# Patient Record
Sex: Male | Born: 1970 | Race: Black or African American | Hispanic: No | Marital: Single | Smoking: Current some day smoker
Health system: Southern US, Community
[De-identification: ages and names within clinical notes are randomized; demographics above are authoritative.]

## PROBLEM LIST (undated history)

## (undated) DIAGNOSIS — I10 Essential (primary) hypertension: Secondary | ICD-10-CM

## (undated) DIAGNOSIS — I219 Acute myocardial infarction, unspecified: Secondary | ICD-10-CM

## (undated) HISTORY — PX: CORONARY ANGIOPLASTY WITH STENT PLACEMENT: SHX49

## (undated) HISTORY — DX: Morbid (severe) obesity due to excess calories: E66.01

---

## 1998-03-03 ENCOUNTER — Emergency Department (HOSPITAL_COMMUNITY): Admission: EM | Admit: 1998-03-03 | Discharge: 1998-03-03 | Payer: Self-pay | Admitting: Emergency Medicine

## 2005-07-02 ENCOUNTER — Emergency Department (HOSPITAL_COMMUNITY): Admission: EM | Admit: 2005-07-02 | Discharge: 2005-07-02 | Payer: Self-pay | Admitting: *Deleted

## 2005-12-21 ENCOUNTER — Emergency Department (HOSPITAL_COMMUNITY): Admission: EM | Admit: 2005-12-21 | Discharge: 2005-12-21 | Payer: Self-pay | Admitting: *Deleted

## 2006-05-14 ENCOUNTER — Emergency Department (HOSPITAL_COMMUNITY): Admission: EM | Admit: 2006-05-14 | Discharge: 2006-05-15 | Payer: Self-pay | Admitting: Emergency Medicine

## 2006-05-21 ENCOUNTER — Encounter: Admission: RE | Admit: 2006-05-21 | Discharge: 2006-06-24 | Payer: Self-pay | Admitting: Family Medicine

## 2006-07-15 ENCOUNTER — Emergency Department (HOSPITAL_COMMUNITY): Admission: EM | Admit: 2006-07-15 | Discharge: 2006-07-15 | Payer: Self-pay | Admitting: *Deleted

## 2008-03-05 ENCOUNTER — Emergency Department (HOSPITAL_COMMUNITY): Admission: EM | Admit: 2008-03-05 | Discharge: 2008-03-05 | Payer: Self-pay | Admitting: Emergency Medicine

## 2015-01-24 ENCOUNTER — Emergency Department (HOSPITAL_COMMUNITY): Payer: 59

## 2015-01-24 ENCOUNTER — Emergency Department (HOSPITAL_COMMUNITY)
Admission: EM | Admit: 2015-01-24 | Discharge: 2015-01-25 | Disposition: A | Discharge status: Home / Self Care | Payer: 59 | Attending: Emergency Medicine | Admitting: Emergency Medicine

## 2015-01-24 ENCOUNTER — Encounter (HOSPITAL_COMMUNITY): Payer: Self-pay | Admitting: *Deleted

## 2015-01-24 DIAGNOSIS — I10 Essential (primary) hypertension: Secondary | ICD-10-CM | POA: Insufficient documentation

## 2015-01-24 DIAGNOSIS — M545 Low back pain: Secondary | ICD-10-CM | POA: Diagnosis not present

## 2015-01-24 DIAGNOSIS — R1011 Right upper quadrant pain: Secondary | ICD-10-CM | POA: Diagnosis not present

## 2015-01-24 DIAGNOSIS — R109 Unspecified abdominal pain: Secondary | ICD-10-CM | POA: Diagnosis present

## 2015-01-24 DIAGNOSIS — Z79899 Other long term (current) drug therapy: Secondary | ICD-10-CM | POA: Diagnosis not present

## 2015-01-24 DIAGNOSIS — R112 Nausea with vomiting, unspecified: Secondary | ICD-10-CM | POA: Insufficient documentation

## 2015-01-24 HISTORY — DX: Essential (primary) hypertension: I10

## 2015-01-24 LAB — URINALYSIS, ROUTINE W REFLEX MICROSCOPIC
Bilirubin Urine: NEGATIVE
Glucose, UA: NEGATIVE mg/dL
Hgb urine dipstick: NEGATIVE
Ketones, ur: NEGATIVE mg/dL
Leukocytes, UA: NEGATIVE
Nitrite: NEGATIVE
Protein, ur: NEGATIVE mg/dL
Specific Gravity, Urine: 1.025 (ref 1.005–1.030)
Urobilinogen, UA: 0.2 mg/dL (ref 0.0–1.0)
pH: 6.5 (ref 5.0–8.0)

## 2015-01-24 LAB — CBC WITH DIFFERENTIAL/PLATELET
BASOS ABS: 0 10*3/uL (ref 0.0–0.1)
Basophils Relative: 0 %
Eosinophils Absolute: 0 10*3/uL (ref 0.0–0.7)
Eosinophils Relative: 0 %
HEMATOCRIT: 41.5 % (ref 39.0–52.0)
Hemoglobin: 14.8 g/dL (ref 13.0–17.0)
LYMPHS PCT: 16 %
Lymphs Abs: 1.6 10*3/uL (ref 0.7–4.0)
MCH: 29.4 pg (ref 26.0–34.0)
MCHC: 35.7 g/dL (ref 30.0–36.0)
MCV: 82.3 fL (ref 78.0–100.0)
Monocytes Absolute: 0.6 10*3/uL (ref 0.1–1.0)
Monocytes Relative: 6 %
NEUTROS ABS: 8.2 10*3/uL — AB (ref 1.7–7.7)
Neutrophils Relative %: 78 %
PLATELETS: 274 10*3/uL (ref 150–400)
RBC: 5.04 MIL/uL (ref 4.22–5.81)
RDW: 14.3 % (ref 11.5–15.5)
WBC: 10.4 10*3/uL (ref 4.0–10.5)

## 2015-01-24 LAB — COMPREHENSIVE METABOLIC PANEL WITH GFR
ALT: 53 U/L (ref 17–63)
AST: 34 U/L (ref 15–41)
Albumin: 4.6 g/dL (ref 3.5–5.0)
Alkaline Phosphatase: 96 U/L (ref 38–126)
Anion gap: 4 — ABNORMAL LOW (ref 5–15)
BUN: 12 mg/dL (ref 6–20)
CO2: 25 mmol/L (ref 22–32)
Calcium: 8.8 mg/dL — ABNORMAL LOW (ref 8.9–10.3)
Chloride: 104 mmol/L (ref 101–111)
Creatinine, Ser: 0.96 mg/dL (ref 0.61–1.24)
GFR calc Af Amer: >60 mL/min
GFR calc non Af Amer: >60 mL/min
Glucose, Bld: 166 mg/dL — ABNORMAL HIGH (ref 65–99)
Potassium: 3.2 mmol/L — ABNORMAL LOW (ref 3.5–5.1)
Sodium: 133 mmol/L — ABNORMAL LOW (ref 135–145)
Total Bilirubin: 0.5 mg/dL (ref 0.3–1.2)
Total Protein: 8 g/dL (ref 6.5–8.1)

## 2015-01-24 LAB — LIPASE, BLOOD: LIPASE: 20 U/L — AB (ref 22–51)

## 2015-01-24 MED ORDER — ONDANSETRON HCL 4 MG/2ML IJ SOLN
4.0000 mg | Freq: Once | INTRAMUSCULAR | Status: AC
  Administered 2015-01-25: 4 mg via INTRAVENOUS
  Filled 2015-01-24: qty 2

## 2015-01-24 MED ORDER — ONDANSETRON HCL 4 MG/2ML IJ SOLN
4.0000 mg | Freq: Once | INTRAMUSCULAR | Status: AC
  Administered 2015-01-24: 4 mg via INTRAVENOUS
  Filled 2015-01-24: qty 2

## 2015-01-24 MED ORDER — SODIUM CHLORIDE 0.9 % IV BOLUS (SEPSIS)
1000.0000 mL | Freq: Once | INTRAVENOUS | Status: AC
  Administered 2015-01-24: 1000 mL via INTRAVENOUS

## 2015-01-24 MED ORDER — NAPROXEN 500 MG PO TABS: 500.0000 mg | ORAL_TABLET | Freq: Two times a day (BID) | ORAL | Status: DC

## 2015-01-24 MED ORDER — FENTANYL CITRATE (PF) 100 MCG/2ML IJ SOLN
50.0000 ug | Freq: Once | INTRAMUSCULAR | Status: AC
  Administered 2015-01-24: 50 ug via INTRAVENOUS
  Filled 2015-01-24: qty 2

## 2015-01-24 MED ORDER — KETOROLAC TROMETHAMINE 30 MG/ML IJ SOLN
30.0000 mg | Freq: Once | INTRAMUSCULAR | Status: AC
  Administered 2015-01-25: 30 mg via INTRAVENOUS
  Filled 2015-01-24: qty 1

## 2015-01-24 MED ORDER — SODIUM CHLORIDE 0.9 % IV SOLN: INTRAVENOUS | Status: DC

## 2015-01-24 MED ORDER — HYDROCODONE-ACETAMINOPHEN 5-325 MG PO TABS: 1.0000 | ORAL_TABLET | Freq: Four times a day (QID) | ORAL | Status: DC | PRN

## 2015-01-24 MED ORDER — PROMETHAZINE HCL 25 MG PO TABS: 25.0000 mg | ORAL_TABLET | Freq: Four times a day (QID) | ORAL | Status: DC | PRN

## 2015-01-24 NOTE — ED Notes (Addendum)
Pt reporting right sided abdominal pain that radiates into back.  Reporting pain began a couple hours ago.  Reporting associated nausea. Reports pain began after eating.

## 2015-01-24 NOTE — ED Provider Notes (Addendum)
CSN: [id number]     Arrival date & time 01/24/15  2129 History   By signing my name below, I, Arlan Organ, attest that this documentation has been prepared under the direction and in the presence of Vanetta Mulders, MD. Electronically Signed: Arlan Organ, ED Scribe. 01/24/2015. 10:17 PM.   Chief Complaint  Patient presents with  . Abdominal Pain   The history is provided by the patient. No language interpreter was used.    HPI Comments: Benjamin Huang is a 44 y.o. male with a PMHx of HTN who presents to the Emergency Department complaining of constant, sudden, ongoing R sided abdominal pain that radiates into the back and groin x 3 hours after eating. Pain is currently rated 10/10. No aggravating or alleviating factors at this time. However, pt states "i cant get comfortable at all". Ongoing nausea and vomiting also reported. 2 episodes reported today. No OTC medications or home remedies attempted prior to arrival. No recent fever, chills, vomiting, diarrhea, dysuria, chest pain, or shortness of breath. Denies any personal history of kidney stones but admits to a family history. No known allergies to medications, but states he prefers not to take NSAIDS.  Past Medical History  Diagnosis Date  . HTN (hypertension)    History reviewed. No pertinent past surgical history. History reviewed. No pertinent family history. Social History  Substance Use Topics  . Smoking status: Never Smoker   . Smokeless tobacco: None  . Alcohol Use: No    Review of Systems  Constitutional: Negative for fever and chills.  HENT: Negative for rhinorrhea and sore throat.   Eyes: Negative for visual disturbance.  Respiratory: Negative for cough and shortness of breath.   Cardiovascular: Negative for chest pain and leg swelling.  Gastrointestinal: Positive for nausea, vomiting and abdominal pain. Negative for diarrhea.  Genitourinary: Positive for flank pain. Negative for dysuria.  Musculoskeletal: Positive  for back pain. Negative for joint swelling and neck pain.  Skin: Negative for rash.  Neurological: Negative for dizziness, light-headedness and headaches.  Hematological: Does not bruise/bleed easily.  Psychiatric/Behavioral: Negative for confusion.  All other systems reviewed and are negative.     Allergies  Review of patient's allergies indicates no known allergies.  Home Medications   Prior to Admission medications   Medication Sig Start Date End Date Taking? Authorizing Provider  amLODipine (NORVASC) 10 MG tablet Take 10 mg by mouth daily.   Yes Historical Provider, MD  hydrochlorothiazide (HYDRODIURIL) 25 MG tablet Take 25 mg by mouth daily.   Yes Historical Provider, MD  HYDROcodone-acetaminophen (NORCO/VICODIN) 5-325 MG tablet Take 1-2 tablets by mouth every 6 (six) hours as needed. 01/24/15   Vanetta Mulders, MD  naproxen (NAPROSYN) 500 MG tablet Take 1 tablet (500 mg total) by mouth 2 (two) times daily. 01/24/15   Vanetta Mulders, MD  promethazine (PHENERGAN) 25 MG tablet Take 1 tablet (25 mg total) by mouth every 6 (six) hours as needed. 01/24/15   Vanetta Mulders, MD   Triage Vitals: BP 150/90 mmHg  Pulse 80  Temp(Src) 97.9 F (36.6 C) (Oral)  Resp 14  Ht  (1.854 m)  Wt 290 lb (131.543 kg)  BMI 38.27 kg/m2  SpO2 100%   Physical Exam  Constitutional: He is oriented to person, place, and time. He appears well-developed and well-nourished.  HENT:  Head: Normocephalic and atraumatic.  Eyes: Conjunctivae and EOM are normal. Pupils are equal, round, and reactive to light.  Eyes track normal   Neck: Normal range of  motion.  Cardiovascular: Normal rate, regular rhythm, normal heart sounds and intact distal pulses.   Pulmonary/Chest: Effort normal and breath sounds normal. No respiratory distress.  Abdominal: Soft. He exhibits no distension. There is tenderness. There is no rebound and no guarding.  Mild tenderness to RUQ  Musculoskeletal: Normal range of motion.   Neurological: He is alert and oriented to person, place, and time. No cranial nerve deficit. He exhibits normal muscle tone. Coordination normal.  Skin: Skin is warm and dry.  Psychiatric: He has a normal mood and affect. Judgment normal.  Nursing note and vitals reviewed.   ED Course  Procedures (including critical care time)  DIAGNOSTIC STUDIES: Oxygen Saturation is 100% on RA, Normal by my interpretation.    COORDINATION OF CARE: 10:10 PM- Will give fluids, Zofran, and Sublimaze. Will order CT renal stone study, CBC, CMP, Lipase, and urinalysis. Discussed treatment plan with pt at bedside and pt agreed to plan.     Labs Review Labs Reviewed  CBC WITH DIFFERENTIAL/PLATELET - Abnormal; Notable for the following:    Neutro Abs 8.2 (*)    All other components within normal limits  COMPREHENSIVE METABOLIC PANEL - Abnormal; Notable for the following:    Sodium 133 (*)    Potassium 3.2 (*)    Glucose, Bld 166 (*)    Calcium 8.8 (*)    Anion gap 4 (*)    All other components within normal limits  LIPASE, BLOOD - Abnormal; Notable for the following:    Lipase 20 (*)    All other components within normal limits  URINALYSIS, ROUTINE W REFLEX MICROSCOPIC (NOT AT [facility])   Results for orders placed or performed during the hospital encounter of 01/24/15  CBC with Differential/Platelet  Result Value Ref Range   WBC 10.4 4.0 - 10.5 K/uL   RBC 5.04 4.22 - 5.81 MIL/uL   Hemoglobin 14.8 13.0 - 17.0 g/dL   HCT 82.9 56.2 - 13.0 %   MCV 82.3 78.0 - 100.0 fL   MCH 29.4 26.0 - 34.0 pg   MCHC 35.7 30.0 - 36.0 g/dL   RDW 86.5 78.4 - 69.6 %   Platelets 274 150 - 400 K/uL   Neutrophils Relative % 78 %   Neutro Abs 8.2 (H) 1.7 - 7.7 K/uL   Lymphocytes Relative 16 %   Lymphs Abs 1.6 0.7 - 4.0 K/uL   Monocytes Relative 6 %   Monocytes Absolute 0.6 0.1 - 1.0 K/uL   Eosinophils Relative 0 %   Eosinophils Absolute 0.0 0.0 - 0.7 K/uL   Basophils Relative 0 %   Basophils Absolute 0.0 0.0 - 0.1  K/uL  Comprehensive metabolic panel  Result Value Ref Range   Sodium 133 (L) 135 - 145 mmol/L   Potassium 3.2 (L) 3.5 - 5.1 mmol/L   Chloride 104 101 - 111 mmol/L   CO2 25 22 - 32 mmol/L   Glucose, Bld 166 (H) 65 - 99 mg/dL   BUN 12 6 - 20 mg/dL   Creatinine, Ser 2.95 0.61 - 1.24 mg/dL   Calcium 8.8 (L) 8.9 - 10.3 mg/dL   Total Protein 8.0 6.5 - 8.1 g/dL   Albumin 4.6 3.5 - 5.0 g/dL   AST 34 15 - 41 U/L   ALT 53 17 - 63 U/L   Alkaline Phosphatase 96 38 - 126 U/L   Total Bilirubin 0.5 0.3 - 1.2 mg/dL   GFR calc non Af Amer >60 >60 mL/min   GFR calc Af Amer >60 >  60 mL/min   Anion gap 4 (L) 5 - 15  Lipase, blood  Result Value Ref Range   Lipase 20 (L) 22 - 51 U/L  Urinalysis, Routine w reflex microscopic (not at [facility])  Result Value Ref Range   Color, Urine YELLOW YELLOW   APPearance CLEAR CLEAR   Specific Gravity, Urine 1.025 1.005 - 1.030   pH 6.5 5.0 - 8.0   Glucose, UA NEGATIVE NEGATIVE mg/dL   Hgb urine dipstick NEGATIVE NEGATIVE   Bilirubin Urine NEGATIVE NEGATIVE   Ketones, ur NEGATIVE NEGATIVE mg/dL   Protein, ur NEGATIVE NEGATIVE mg/dL   Urobilinogen, UA 0.2 0.0 - 1.0 mg/dL   Nitrite NEGATIVE NEGATIVE   Leukocytes, UA NEGATIVE NEGATIVE     Imaging Review Ct Renal Stone Study  01/24/2015   CLINICAL DATA:  Right flank pain for 3 hr  EXAM: CT ABDOMEN AND PELVIS WITHOUT CONTRAST  TECHNIQUE: Multidetector CT imaging of the abdomen and pelvis was performed following the standard protocol without IV contrast.  COMPARISON:  None.  FINDINGS: No hydronephrosis.  No urinary calculus.  Diffuse hepatic steatosis. Relative sparing adjacent to the gallbladder  Gallbladder, spleen, pancreas, adrenal glands are within normal limits.  Normal appendix.  Bladder and prostate are within normal limits.  Degenerative disc disease at L5-S1 with vacuum disc and disc space narrowing.  IMPRESSION: No evidence of urinary calculus or urinary obstruction. Normal appendix.   Electronically Signed    By: Jolaine Click M.D.   On: 01/24/2015 23:42   I have personally reviewed and evaluated these images and lab results as part of my medical decision-making.   EKG Interpretation None      MDM   Final diagnoses:  Flank pain    Patient with acute onset of right-sided right flank right back pain started a couple hours prior to arrival. No history of kidney stones however there is a family history of kidney stones. Patient felt fine earlier the day then suddenly had very severe pain 10 out of 10. Clinically suggestive of renal colic. Patient was unable to sit still. CT scan ordered renal stone study to confirm. Urinalysis without any hematuria. No significant leukocytosis. Mild hypokalemia and hyponatremia but no other significant electrolyte abnormalities. No liver function test abnormalities. Lipase not consistent with pancreatitis. The possibility of such severe pain could've possibly been gallbladder but this pain did radiate into the right groin clinically most likely renal colic.  I, Jamiere Gulas, personally performed the services described in this documentation. All medical record entries made by the scribe were at my direction and in my presence.  I have reviewed the chart and discharge instructions and agree that the record reflects my personal performance and is accurate and complete. Teresha Hanks.  01/24/2015. 11:55 PM.     Vanetta Mulders, MD 01/24/15 2337   CT scan now without any evidence of renal stone in addition gallbladder is normal appendix is normal. Do not have an explanation for the severe right flank right sided abdominal pain that radiated into the groin. Patient improved significantly with medication here. Patient stable for discharge home.  Vanetta Mulders, MD 01/24/15 2356

## 2015-01-24 NOTE — Discharge Instructions (Signed)
Workup for the pain without any significant abnormalities. CT scan of the abdomen showed no evidence of any kidney stone appendix was normal gallbladder was normal. Take of the Naprosyn as needed for pain. Can supplement with hydrocodone as needed for additional pain. Take Phenergan for nausea and vomiting. Return for any new or worse symptoms. Work note provided.

## 2015-02-18 ENCOUNTER — Other Ambulatory Visit (HOSPITAL_COMMUNITY): Payer: Self-pay | Admitting: Nurse Practitioner

## 2015-02-18 DIAGNOSIS — R1011 Right upper quadrant pain: Secondary | ICD-10-CM

## 2015-02-22 ENCOUNTER — Encounter (HOSPITAL_COMMUNITY)
Admission: RE | Admit: 2015-02-22 | Discharge: 2015-02-22 | Disposition: A | Discharge status: Home / Self Care | Payer: 59 | Source: Ambulatory Visit | Attending: Nurse Practitioner | Admitting: Nurse Practitioner

## 2015-02-22 ENCOUNTER — Encounter (HOSPITAL_COMMUNITY): Payer: Self-pay

## 2015-02-22 DIAGNOSIS — R1011 Right upper quadrant pain: Secondary | ICD-10-CM | POA: Diagnosis present

## 2015-02-22 MED ORDER — TECHNETIUM TC 99M MEBROFENIN IV KIT
5.0000 | PACK | Freq: Once | INTRAVENOUS | Status: DC | PRN
  Administered 2015-02-22: 5 via INTRAVENOUS
  Filled 2015-02-22: qty 6

## 2015-02-22 MED ORDER — STERILE WATER FOR INJECTION IJ SOLN
INTRAMUSCULAR | Status: AC
  Administered 2015-02-22: 2.57 mL via INTRAVENOUS
  Filled 2015-02-22: qty 10

## 2015-02-22 MED ORDER — SINCALIDE 5 MCG IJ SOLR
INTRAMUSCULAR | Status: AC
Start: 2015-02-22 — End: 2015-02-22
  Administered 2015-02-22: 2.57 ug via INTRAVENOUS
  Filled 2015-02-22: qty 5

## 2015-02-22 MED ORDER — SODIUM CHLORIDE 0.9 % IJ SOLN
INTRAMUSCULAR | Status: AC
  Filled 2015-02-22: qty 12

## 2018-12-02 ENCOUNTER — Other Ambulatory Visit (HOSPITAL_COMMUNITY): Payer: Self-pay | Admitting: Family

## 2018-12-02 DIAGNOSIS — R7401 Elevation of levels of liver transaminase levels: Secondary | ICD-10-CM

## 2018-12-10 ENCOUNTER — Ambulatory Visit (HOSPITAL_COMMUNITY)
Admission: RE | Admit: 2018-12-10 | Discharge: 2018-12-10 | Disposition: A | Discharge status: Home / Self Care | Payer: BLUE CROSS/BLUE SHIELD | Source: Ambulatory Visit | Attending: Family | Admitting: Family

## 2018-12-10 ENCOUNTER — Other Ambulatory Visit: Payer: Self-pay

## 2018-12-10 DIAGNOSIS — R740 Nonspecific elevation of levels of transaminase and lactic acid dehydrogenase [LDH]: Secondary | ICD-10-CM | POA: Diagnosis present

## 2018-12-10 DIAGNOSIS — R7401 Elevation of levels of liver transaminase levels: Secondary | ICD-10-CM

## 2019-01-13 ENCOUNTER — Encounter: Payer: Self-pay | Admitting: Internal Medicine

## 2019-02-03 ENCOUNTER — Other Ambulatory Visit: Payer: Self-pay

## 2019-02-03 ENCOUNTER — Ambulatory Visit (INDEPENDENT_AMBULATORY_CARE_PROVIDER_SITE_OTHER): Payer: BLUE CROSS/BLUE SHIELD | Admitting: Nurse Practitioner

## 2019-02-03 ENCOUNTER — Telehealth: Payer: Self-pay

## 2019-02-03 ENCOUNTER — Encounter: Payer: Self-pay | Admitting: Nurse Practitioner

## 2019-02-03 DIAGNOSIS — R7989 Other specified abnormal findings of blood chemistry: Secondary | ICD-10-CM | POA: Diagnosis not present

## 2019-02-03 DIAGNOSIS — K760 Fatty (change of) liver, not elsewhere classified: Secondary | ICD-10-CM | POA: Diagnosis not present

## 2019-02-03 DIAGNOSIS — R935 Abnormal findings on diagnostic imaging of other abdominal regions, including retroperitoneum: Secondary | ICD-10-CM | POA: Diagnosis not present

## 2019-02-03 NOTE — Patient Instructions (Addendum)
Your health issues we discussed today were:   Elevated liver enzymes and mildly abnormal ultrasound: 1. Because of an area of likely "fatty sparing" we will check an MRI to make sure nothing more concerning is present 2. Otherwise, your ultrasound looks like you have fatty liver disease which we discussed 3. I recommend working on diet and exercise to obtain good blood pressure control, cholesterol control, blood sugar control.  This will help with fatty liver 4. When we receive your labs from your primary care provider we will decide any further labs that need to be checked at this time 5. As I discussed, we could refer you to a dietitian at any point you feel this would be helpful 6. I am printing information below related to fatty liver disease 7. Call us if you have any worsening or severe symptoms  Overall I recommend:  1. Return for follow-up in 3 months 2. Call us if you have any questions or concerns 3. Continue your other current medications   Because of recent events of COVID-19 ("Coronavirus"), follow CDC recommendations:  1. Wash your hand frequently 2. Avoid touching your face 3. Stay away from people who are sick 4. If you have symptoms such as fever, cough, shortness of breath then call your healthcare provider for further guidance 5. If you are sick, STAY AT HOME unless otherwise directed by your healthcare provider. 6. Follow directions from state and national officials regarding staying safe   [facility] we value your feedback. You may receive a survey about your visit today. Please share your experience as we strive to create trusting relationships with our patients to provide genuine, compassionate, quality care.  We appreciate your understanding and patience as we review any laboratory studies, imaging, and other diagnostic tests that are ordered as we care for you. Our office policy is 5 business days for review of these results, and any emergent  or urgent results are addressed in a timely manner for your best interest. If you do not hear from our office in 1 week, please contact us.   We also encourage the use of MyChart, which contains your medical information for your review as well. If you are not enrolled in this feature, an access code is on this after visit summary for your convenience. Thank you for allowing us to be involved in your care.  It was great to see you today!  I hope you have a great Fall!!      Fatty Liver Disease  Fatty liver disease occurs when too much fat has built up in your liver cells. Fatty liver disease is also called hepatic steatosis or steatohepatitis. The liver removes harmful substances from your bloodstream and produces fluids that your body needs. It also helps your body use and store energy from the food you eat. In many cases, fatty liver disease does not cause symptoms or problems. It is often diagnosed when tests are being done for other reasons. However, over time, fatty liver can cause inflammation that may lead to more serious liver problems, such as scarring of the liver (cirrhosis) and liver failure. Fatty liver is associated with insulin resistance, increased body fat, high blood pressure (hypertension), and high cholesterol. These are features of metabolic syndrome and increase your risk for stroke, diabetes, and heart disease. What are the causes? This condition may be caused by:  Drinking too much alcohol.  Poor nutrition.  Obesity.  Cushing's syndrome.  Diabetes.  High cholesterol.  Certain drugs.  Poisons.  Some viral infections.  Pregnancy. What increases the risk? You are more likely to develop this condition if you:  Abuse alcohol.  Are overweight.  Have diabetes.  Have hepatitis.  Have a high triglyceride level.  Are pregnant. What are the signs or symptoms? Fatty liver disease often does not cause symptoms. If symptoms do develop, they can include:   Fatigue.  Weakness.  Weight loss.  Confusion.  Abdominal pain.  Nausea and vomiting.  Yellowing of your skin and the white parts of your eyes (jaundice).  Itchy skin. How is this diagnosed? This condition may be diagnosed by:  A physical exam and medical history.  Blood tests.  Imaging tests, such as an ultrasound, CT scan, or MRI.  A liver biopsy. A small sample of liver tissue is removed using a needle. The sample is then looked at under a microscope. How is this treated? Fatty liver disease is often caused by other health conditions. Treatment for fatty liver may involve medicines and lifestyle changes to manage conditions such as:  Alcoholism.  High cholesterol.  Diabetes.  Being overweight or obese. Follow these instructions at home:   Do not drink alcohol. If you have trouble quitting, ask your health care provider how to safely quit with the help of medicine or a supervised program. This is important to keep your condition from getting worse.  Eat a healthy diet as told by your health care provider. Ask your health care provider about working with a diet and nutrition specialist (dietitian) to develop an eating plan.  Exercise regularly. This can help you lose weight and control your cholesterol and diabetes. Talk to your health care provider about an exercise plan and which activities are best for you.  Take over-the-counter and prescription medicines only as told by your health care provider.  Keep all follow-up visits as told by your health care provider. This is important. Contact a health care provider if: You have trouble controlling your:  Blood sugar. This is especially important if you have diabetes.  Cholesterol.  Drinking of alcohol. Get help right away if:  You have abdominal pain.  You have jaundice.  You have nausea and vomiting.  You vomit blood or material that looks like coffee grounds.  You have stools that are black, tar-like,  or bloody. Summary  Fatty liver disease develops when too much fat builds up in the cells of your liver.  Fatty liver disease often causes no symptoms or problems. However, over time, fatty liver can cause inflammation that may lead to more serious liver problems, such as scarring of the liver (cirrhosis).  You are more likely to develop this condition if you abuse alcohol, are pregnant, are overweight, have diabetes, have hepatitis, or have high triglyceride levels.  Contact your health care provider if you have trouble controlling your weight, blood sugar, cholesterol, or drinking of alcohol. This information is not intended to replace advice given to you by your health care provider. Make sure you discuss any questions you have with your health care provider. Document Released: 05/25/2005 Document Revised: 03/22/2017 Document Reviewed: 01/16/2017 Elsevier Patient Education  2020 Elsevier Inc.     Nonalcoholic Fatty Liver Disease Diet, Adult Nonalcoholic fatty liver disease is a condition that causes fat to build up in and around the liver. The disease makes it harder for the liver to work the way that it should. Following a healthy diet can help to keep nonalcoholic fatty liver disease under control. It can also  help to prevent or improve conditions that are associated with the disease, such as heart disease, diabetes, high blood pressure, and abnormal cholesterol levels. Along with regular exercise, this diet:  Promotes weight loss.  Helps to control blood sugar levels.  Helps to improve the way that the body uses insulin. What are tips for following this plan? Reading food labels Always check food labels for:  The amount of saturated fat in a food. You should limit your intake of saturated fat. Saturated fat is found in foods that come from animals, including meat and dairy products such as butter, cheese, and whole milk.  The amount of fiber in a food. You should choose  high-fiber foods such as fruits, vegetables, and whole grains. Try to get 25-30 grams (g) of fiber a day.  Cooking  When cooking, use heart-healthy oils that are high in monounsaturated fats. These include olive oil, canola oil, and avocado oil.  Limit frying or deep-frying foods. Cook foods using healthy methods such as baking, boiling, steaming, and grilling instead. Meal planning  You may want to keep track of how many calories you take in. Eating the right amount of calories will help you achieve a healthy weight. Meeting with a registered dietitian can help you get started.  Limit how often you eat takeout and fast food. These foods are usually very high in fat, salt, and sugar.  Use the glycemic index (GI) to plan your meals. The index tells you how quickly a food will raise your blood sugar. Choose low-GI foods (GI less than 55). These foods take a longer time to raise blood sugar. A registered dietitian can help you identify foods lower on the GI scale. Lifestyle  You may want to follow a Mediterranean diet. This diet includes a lot of vegetables, lean meats or fish, whole grains, fruits, and healthy oils and fats. What foods can I eat?  Fruits Bananas. Apples. Oranges. Grapes. Papaya. Mango. Pomegranate. Kiwi. Grapefruit. Cherries. Vegetables Lettuce. Spinach. Peas. Beets. Cauliflower. Cabbage. Broccoli. Carrots. Tomatoes. Squash. Eggplant. Herbs. Peppers. Onions. Cucumbers. Brussels sprouts. Yams and sweet potatoes. Beans. Lentils. Grains Whole wheat or whole-grain foods, including breads, crackers, cereals, and pasta. Stone-ground whole wheat. Unsweetened oatmeal. Bulgur. Barley. Quinoa. Minn[place] or wild rice. Corn or whole wheat flour tortillas. Meats and other proteins Lean meats. Poultry. Tofu. Seafood and shellfish. Dairy Low-fat or fat-free dairy products, such as yogurt, cottage cheese, or cheese. Beverages Water. Sugar-free drinks. Tea. Coffee. Low-fat or skim milk. Milk  alternatives, such as soy or almond milk. Real fruit juice. Fats and oils Avocado. Canola or olive oil. Nuts and nut butters. Seeds. Seasonings and condiments Mustard. Relish. Low-fat, low-sugar ketchup and barbecue sauce. Low-fat or fat-free mayonnaise. Sweets and desserts Sugar-free sweets. The items listed above may not be a complete list of foods and beverages you can eat. Contact a dietitian for more information. What foods should I limit or avoid? Meats and other proteins Limit red meat to 1-2 times a week. Dairy [employer]. Fats and oils Palm oil and coconut oil. Fried foods. Other foods Processed foods. Foods that contain a lot of salt or sodium. Sweets and desserts Sweets that contain sugar. Beverages Sweetened drinks, such as sweet tea, milkshakes, iced sweet drinks, and sodas. Alcohol. The items listed above may not be a complete list of foods and beverages you should avoid. Contact a dietitian for more information. Where to find more information The [employer] of Diabetes and Digestive and Kidney Diseases: [email or web address] Summary  Nonalcoholic fatty liver disease is a condition that causes fat to build up in and around the liver.  Following a healthy diet can help to keep nonalcoholic fatty liver disease under control. Your diet should be rich in fruits, vegetables, whole grains, and lean proteins.  Limit your intake of saturated fat. Saturated fat is found in foods that come from animals, including meat and dairy products such as butter, cheese, and whole milk.  This diet promotes weight loss, helps to control blood sugar levels, and helps to improve the way that the body uses insulin. This information is not intended to replace advice given to you by your health care provider. Make sure you discuss any questions you have with your health care provider. Document Released: 08/24/2014 Document Revised: 08/01/2018 Document Reviewed: 05/01/2018 Elsevier Patient  Education  2020 [employer].

## 2019-02-03 NOTE — Assessment & Plan Note (Signed)
Ultrasound demonstrated hepatic steatosis with a few nonspecific hypoechoic areas adjacent to the gallbladder fossa, one of which appears masslike measures 1.8 cm in diameter that most likely represents focal fatty sparing as this was present on prior CT in October 2016.  However, unable to definitively say on ultrasound and recommended liver MRI.  At this point we will proceed with MRI of the liver.  Follow-up in 3 months.

## 2019-02-03 NOTE — Addendum Note (Signed)
Addended by: Gordy Levan, Averie Meiner A on: 02/03/2019 10:14 AM   Modules accepted: Orders

## 2019-02-03 NOTE — Assessment & Plan Note (Signed)
The patient had reported abnormal LFTs.  However, labs from included with his office visit.  LFTs in 2016 were normal.  We have requested his updated LFTs from his primary care.  Given his ultrasound findings, body habitus, past medical history of hypertension he likely has fatty liver disease.  At this point will hold off on further serologic evaluation until we receive his previously drawn LFTs.  We may simply just need to repeat his labs for monitoring.  I discussed with him fatty liver disease and the treatment cornerstones including diet and exercise.  Discussed the need for blood pressure, cholesterol, blood sugar control.  I offered that we could refer to a dietitian at any point that he needs or would like this.  He has previously lost weight and will start working on this again.  Follow-up in 3 months.  I will provide printed information on fatty liver disease.

## 2019-02-03 NOTE — Progress Notes (Signed)
Primary Care Physician:  Erasmo Downer, NP Primary Gastroenterologist:  Dr. Jena Gauss  Chief Complaint  Patient presents with  . Elevated Hepatic Enzymes    HPI:   Benjamin Huang is a 48 y.o. male who presents on referral from primary care for elevated liver enzymes.  Reviewed information provided with referral including his visit dated 11/17/2018 which is a physical exam.  Noted BMI elevated between 40-45.  As part of his physical a CMP was checked which apparently had elevated liver enzymes.  Labs were not included with this referral.  Ultrasound report (as reviewed below) was included.  Only CMP on file with our system dated 01/24/2015 which found completely normal LFTs.  Ultrasound of the abdomen dated 12/10/2018 which found findings consistent with hepatic steatosis as well as a few nonspecific hypoechoic areas adjacent to the gallbladder fossa, one of which appears masslike measures 1.8 cm in diameter that most likely represents focal fatty sparing as this was present on prior CT in October 2016.  Liver MRI may be considered for further evaluation to exclude a pathologic lesion.  No history of colonoscopy or endoscopy in our system.  Today he states he's doing well overall. Has been told he needs to lose weight for a while. He admits some intermittent mild discomfort which is pretty rare, typically improves when he loses weight and eats better; when he slacks and puts on more weight the discomfort becomes more common. Denies N/V, hematochezia, melena, fever, chills, unintentional weight loss. Denies URI or flu-like symptoms. Denies loss of sense of taste or smell. Denies chest pain, dyspnea, dizziness, lightheadedness, syncope, near syncope. Denies any other upper or lower GI symptoms.  He states his BP is often elevated in the morning, just took BP meds 10 mins prior to his OV. Denies headache, dizziness, chest pain.  Past Medical History:  Diagnosis Date  . HTN (hypertension)   .  Obesity, morbid, BMI 40.0-49.9 (HCC)     History reviewed. No pertinent surgical history.  Current Outpatient Medications  Medication Sig Dispense Refill  . amLODipine (NORVASC) 10 MG tablet Take 10 mg by mouth daily.    . hydrochlorothiazide (HYDRODIURIL) 25 MG tablet Take 25 mg by mouth daily.    Marland Kitchen losartan (COZAAR) 50 MG tablet Take 1 tablet by mouth daily.    . Omega-3 Fatty Acids (FISH OIL PO) Take by mouth as needed.     No current facility-administered medications for this visit.     Allergies as of 02/03/2019  . (No Known Allergies)    Family History  Problem Relation Age of Onset  . Colon cancer Neg Hx   . Liver disease Neg Hx     Social History   Socioeconomic History  . Marital status: Single    Spouse name: Not on file  . Number of children: Not on file  . Years of education: Not on file  . Highest education level: Not on file  Occupational History  . Not on file  Social Needs  . Financial resource strain: Not on file  . Food insecurity    Worry: Not on file    Inability: Not on file  . Transportation needs    Medical: Not on file    Non-medical: Not on file  Tobacco Use  . Smoking status: Current Some Day Smoker    Types: Cigars  . Smokeless tobacco: Never Used  . Tobacco comment: cigar every now and then  Substance and Sexual Activity  .  Alcohol use: Yes    Comment: occas  . Drug use: No  . Sexual activity: Not on file  Lifestyle  . Physical activity    Days per week: Not on file    Minutes per session: Not on file  . Stress: Not on file  Relationships  . Social Herbalist on phone: Not on file    Gets together: Not on file    Attends religious service: Not on file    Active member of club or organization: Not on file    Attends meetings of clubs or organizations: Not on file    Relationship status: Not on file  . Intimate partner violence    Fear of current or ex partner: Not on file    Emotionally abused: Not on file     Physically abused: Not on file    Forced sexual activity: Not on file  Other Topics Concern  . Not on file  Social History Narrative  . Not on file    Review of Systems: General: Negative for anorexia, weight loss, fever, chills, fatigue, weakness. ENT: Negative for hoarseness, difficulty swallowing. CV: Negative for chest pain, angina, palpitations, peripheral edema.  Respiratory: Negative for dyspnea at rest, cough, sputum, wheezing.  GI: See history of present illness. MS: Negative for joint pain, low back pain.  Derm: Negative for rash or itching.  Endo: Negative for unusual weight change.  Heme: Negative for bruising or bleeding. Allergy: Negative for rash or hives.    Physical Exam: BP (!) 157/106   Pulse 83   Temp (!) 97 F (36.1 C)   Ht 6' (1.829 m)   Wt (!) 310 lb (140.6 kg)   BMI 42.04 kg/m  General:   Obese male. Alert and oriented. Pleasant and cooperative. Well-nourished and well-developed.  Head:  Normocephalic and atraumatic. Eyes:  Without icterus, sclera clear and conjunctiva pink.  Ears:  Normal auditory acuity. Cardiovascular:  S1, S2 present without murmurs appreciated. Extremities without clubbing or edema. Respiratory:  Clear to auscultation bilaterally. No wheezes, rales, or rhonchi. No distress.  Gastrointestinal:  +BS, soft, non-tender and non-distended. No HSM noted. No guarding or rebound. No masses appreciated.  Rectal:  Deferred  Musculoskalatal:  Symmetrical without gross deformities. Neurologic:  Alert and oriented x4;  grossly normal neurologically. Psych:  Alert and cooperative. Normal mood and affect. Heme/Lymph/Immune: No excessive bruising noted.    02/03/2019 9:53 AM   Disclaimer: This note was dictated with voice recognition software. Similar sounding words can inadvertently be transcribed and may not be corrected upon review.

## 2019-02-03 NOTE — Telephone Encounter (Signed)
PA for MRI Liver w/wo contrast submitted via [employer]. Case approved. Order ID: [id number], valid 02/03/19-08/01/19. [facility] as [facility] [address] [place], [place]. However pt will be having MRI at [facility] in [place]. Tried to change facility but case under review and unable to change facility at this time.

## 2019-02-12 ENCOUNTER — Ambulatory Visit (HOSPITAL_COMMUNITY): Payer: BLUE CROSS/BLUE SHIELD

## 2019-02-12 NOTE — Telephone Encounter (Signed)
Called [facility], spoke to [employer]. Servicing provider changed to [employer]

## 2019-02-24 ENCOUNTER — Other Ambulatory Visit: Payer: Self-pay

## 2019-02-24 ENCOUNTER — Ambulatory Visit (HOSPITAL_COMMUNITY)
Admission: RE | Admit: 2019-02-24 | Discharge: 2019-02-24 | Disposition: A | Discharge status: Home / Self Care | Payer: BLUE CROSS/BLUE SHIELD | Source: Ambulatory Visit | Attending: Nurse Practitioner | Admitting: Nurse Practitioner

## 2019-02-24 DIAGNOSIS — K760 Fatty (change of) liver, not elsewhere classified: Secondary | ICD-10-CM

## 2019-02-24 LAB — POCT I-STAT CREATININE: Creatinine, Ser: 0.8 mg/dL (ref 0.61–1.24)

## 2019-02-24 MED ORDER — GADOBUTROL 1 MMOL/ML IV SOLN
10.0000 mL | Freq: Once | INTRAVENOUS | Status: AC | PRN
  Administered 2019-02-24: 15:00:00 10 mL via INTRAVENOUS

## 2019-05-06 ENCOUNTER — Ambulatory Visit: Payer: BLUE CROSS/BLUE SHIELD | Admitting: Nurse Practitioner

## 2019-09-09 IMAGING — US ULTRASOUND ABDOMEN COMPLETE
1 series · 13 of 25 positions shown · non-contrast
Comparison: CT abdomen pelvis 01/23/2017

CLINICAL DATA: Elevated liver enzymes

EXAM:
ABDOMEN ULTRASOUND COMPLETE

[Series 1: ultrasound abdomen complete · 0.22mm/px · 13 of 136 slices shown]
[im 1/136]
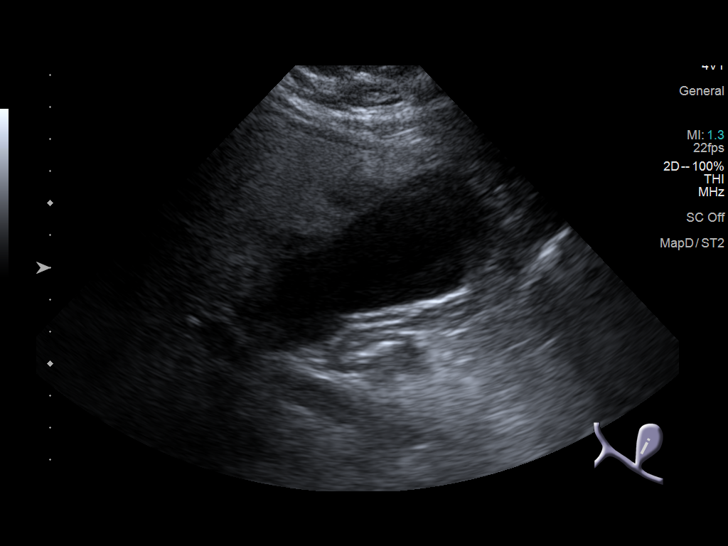
[im 12/136]
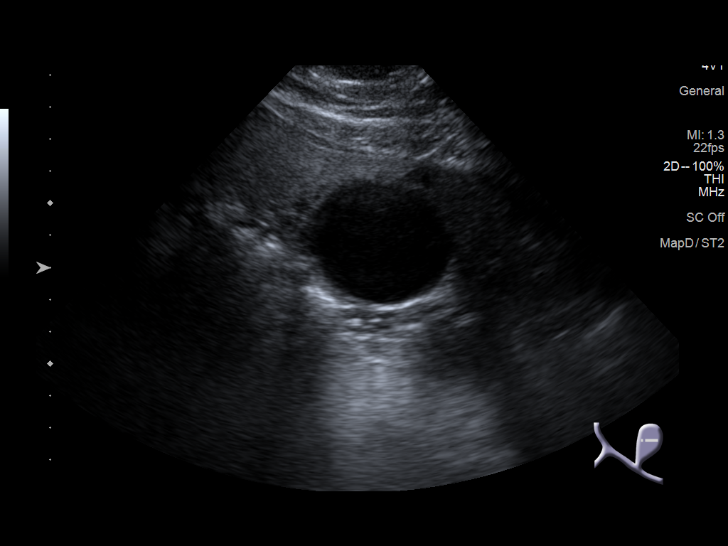
[im 23/136]
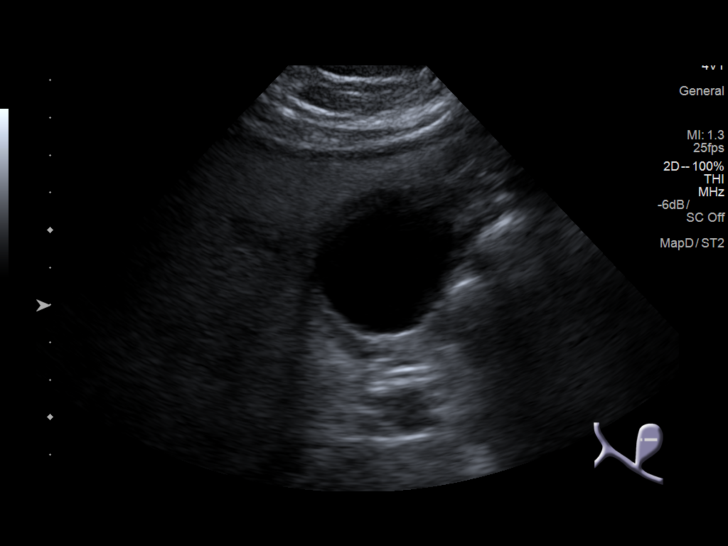
[im 34/136]
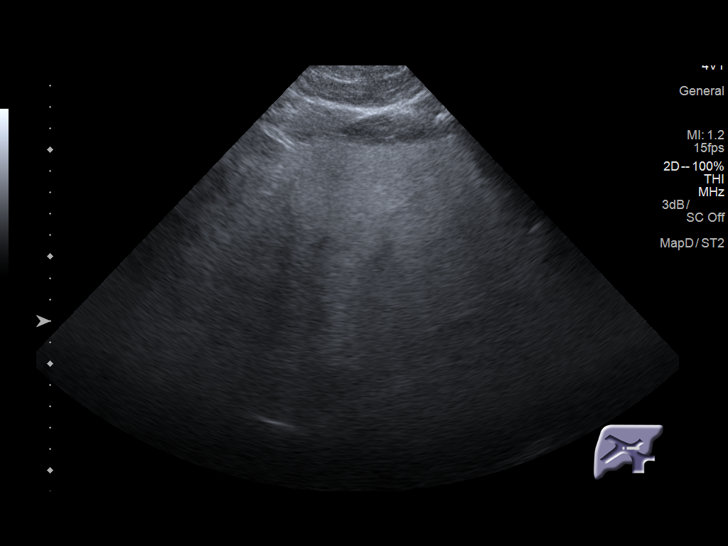
[im 46/136]
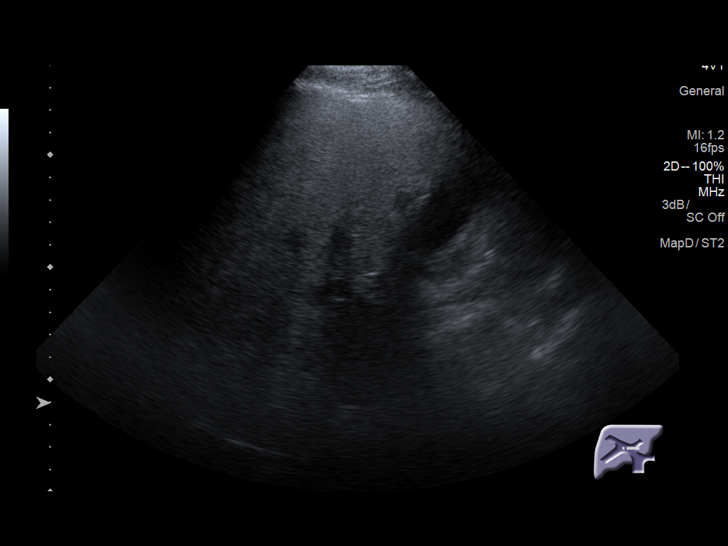
[im 57/136]
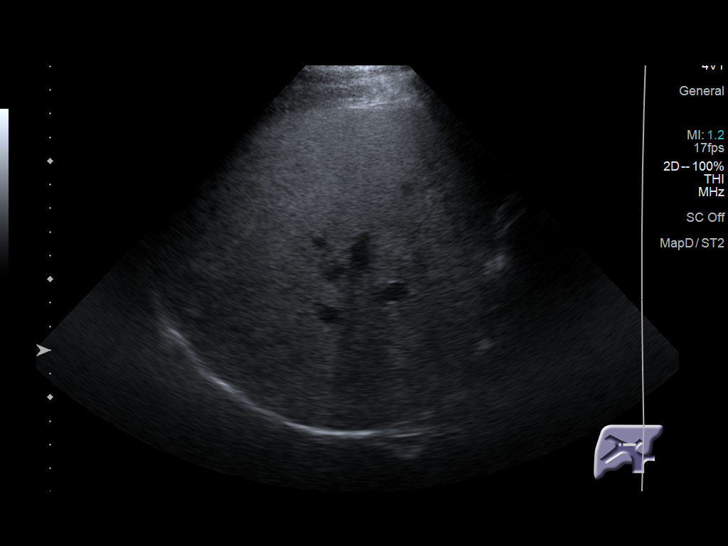
[im 68/136]
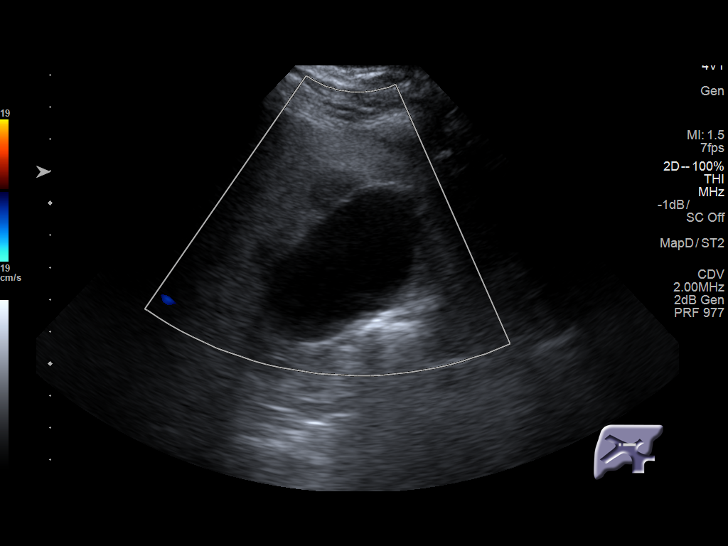
[im 79/136]
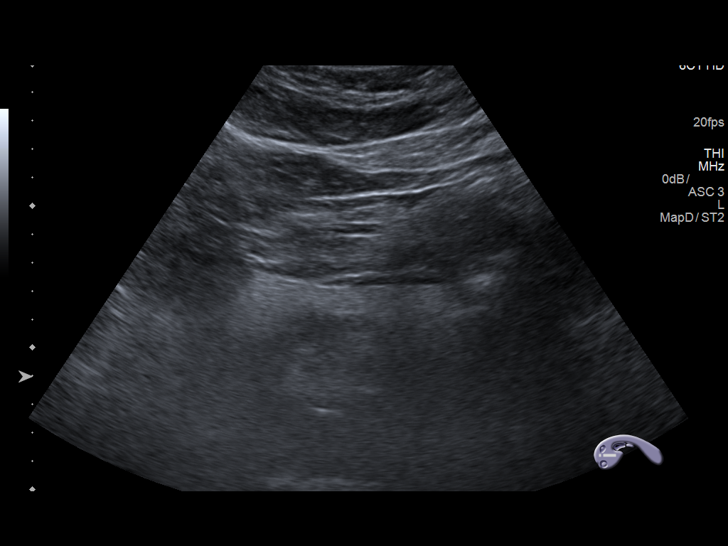
[im 91/136]
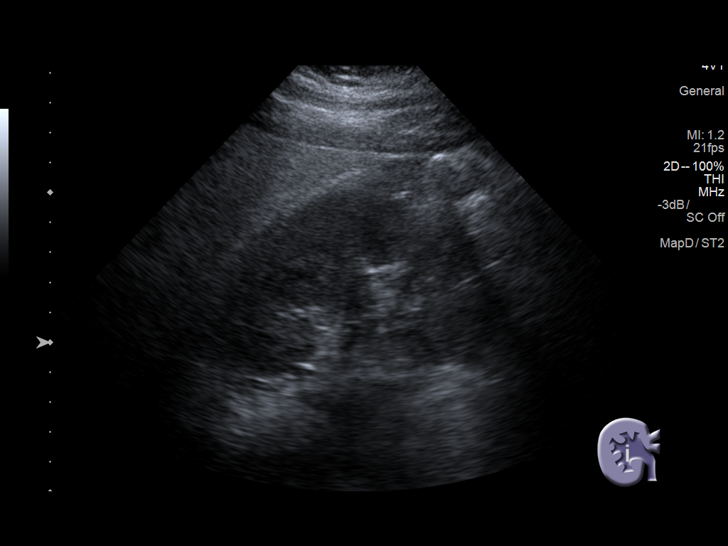
[im 102/136]
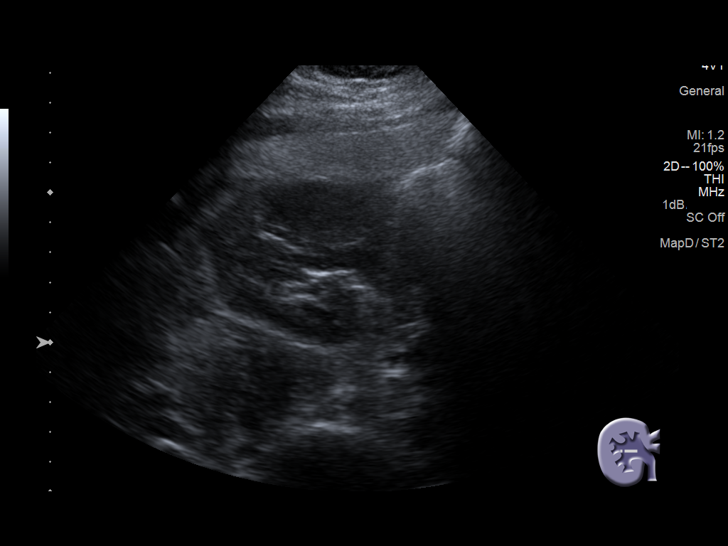
[im 113/136]
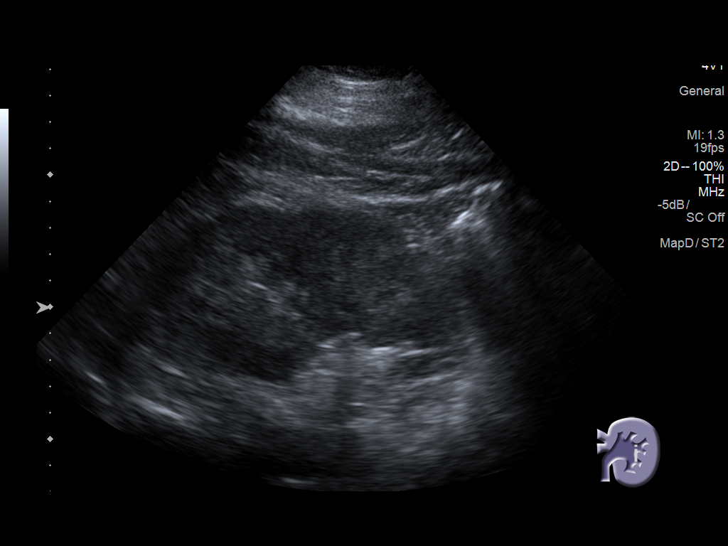
[im 124/136]
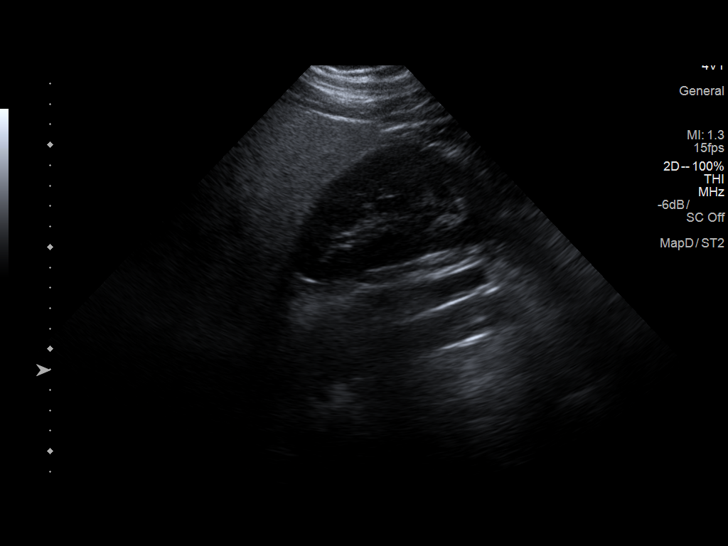
[im 136/136]
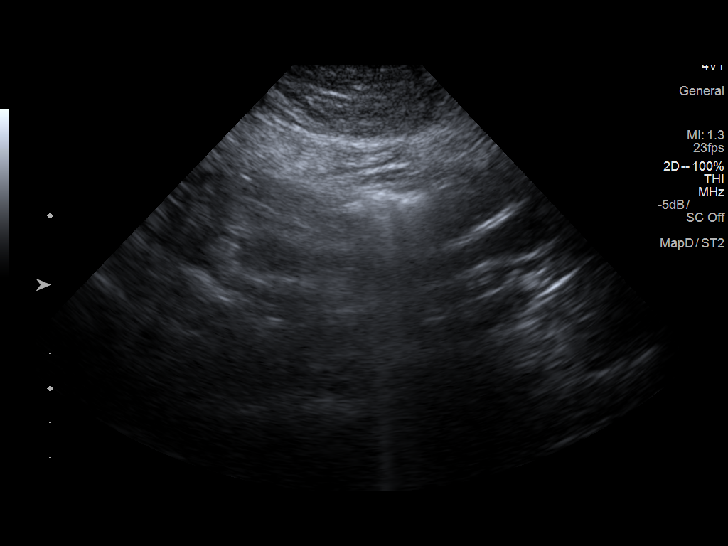

[13 of 25 positions shown; findings below may reference images not displayed]

FINDINGS: Gallbladder: The gallbladder is distended. Gallbladder wall
thickness measures 1.4 mm. Negative sonographic Murphy sign. No
pericholecystic fluid. No gallstones identified.

Common bile duct: Diameter: 1.6 mm

Liver: Echogenicities appears diffusely increased. There are small
hypoechoic areas adjacent to the gallbladder fossa, 1 of which
appears more masslike measuring 1.9 by 1.5 x 1.7 cm. Portal vein is
patent on color Doppler imaging with normal direction of blood flow
towards the liver.

IVC: No abnormality visualized.

Pancreas: Visualization limited by bowel gas.

Spleen: Size and appearance within normal limits.

Right Kidney: Length: 12.0 cm. Echogenicity within normal limits. No
mass or hydronephrosis visualized.

Left Kidney: Length: 11.8 cm. Echogenicity within normal limits. No
mass or hydronephrosis visualized.

Abdominal aorta: No aneurysm visualized.

Other findings: None.
IMPRESSION: 1. Diffusely increased echogenicity of liver as can be seen in
hepatic steatosis.

2. There are a few nonspecific hypoechoic areas adjacent to the
gallbladder fossa, one of which appears more masslike measuring
cm in diameter. This most likely represents focal fatty sparing
which was present on the prior CT from January 2015. Liver MRI may
be considered for further evaluation, to exclude pathologic lesion.

## 2021-02-03 IMAGING — MR MR ABDOMEN WO/W CM
17 series · 48 of 48 positions shown · IV contrast (gadavist)
Comparison: Ultrasound on 12/10/2018

CLINICAL DATA: Elevated liver enzymes. Liver lesions on recent
ultrasound.

EXAM:
MRI ABDOMEN WITHOUT AND WITH CONTRAST
TECHNIQUE: Multiplanar multisequence MR imaging of the abdomen was performed
both before and after the administration of intravenous contrast.
CONTRAST:  10mL GADAVIST GADOBUTROL 1 MMOL/ML IV SOLN

[Series 4: cor haste · coronal · 6.0mm · 1.31mm/px · 2 of 40 slices shown]
[im 1/40]
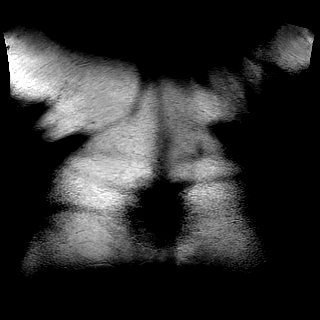
[im 40/40]
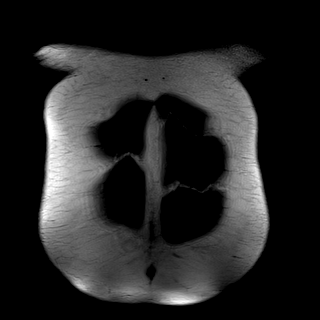

[Series 5: T2 fat-sat · axial · 6.0mm · 1.25mm/px · z∈[-183,+98]mm · 2 of 40 slices shown]
[im 1/40]
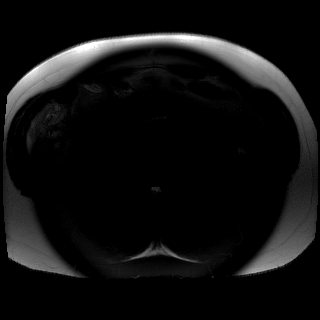
[im 40/40]
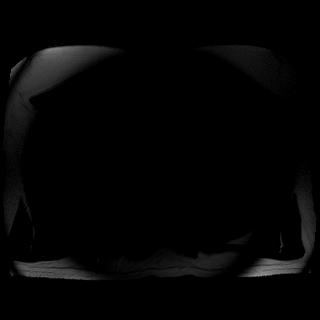

[Series 7: DWI · axial · 6.0mm · 1.49mm/px · z∈[-183,+98]mm · 5 of 120 slices shown (1 of 2)]
[im 1/120]
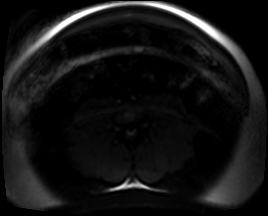
[im 30/120]
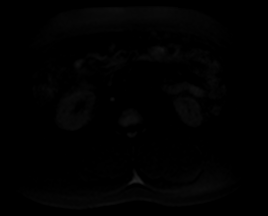
[im 60/120]
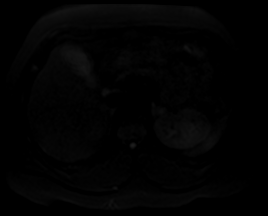
[im 90/120]
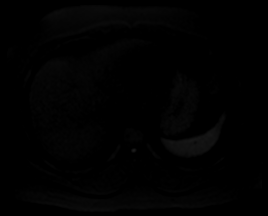
[im 120/120]
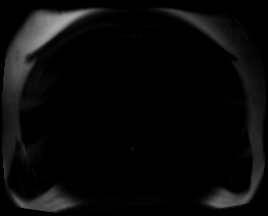

[Series 8: DWI · axial · 6.0mm · 1.49mm/px · z∈[-183,+98]mm · 2 of 40 slices shown (2 of 2)]
[im 1/40]
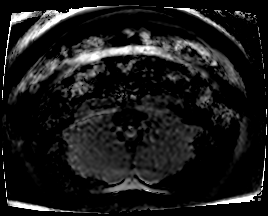
[im 40/40]
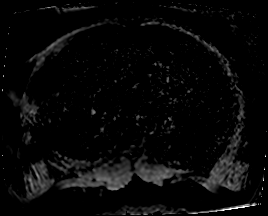

[Series 9: bSSFP · axial · 6.0mm · 0.78mm/px · 1 of 40 slices shown]
[im 1/40]
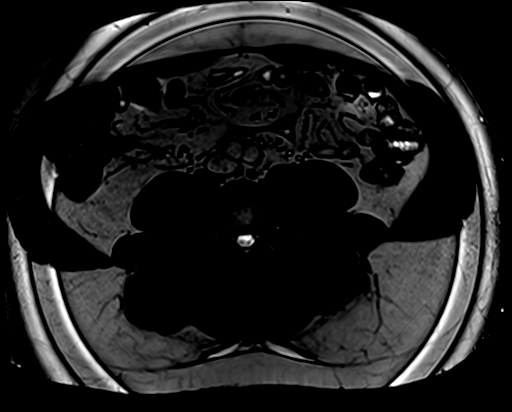

[Series 10: t1_vibe_fs_tra_p4_bh_pre · axial · 3.0mm · 1.25mm/px · z∈[-185,+100]mm · 3 of 96 slices shown]
[im 1/96]
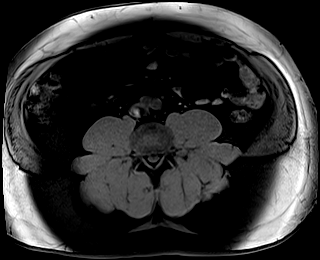
[im 48/96]
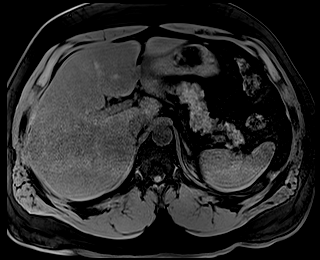
[im 96/96]
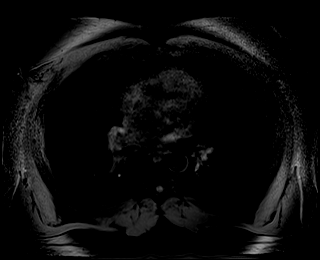

[Series 12: t1_vibe_fs_tra_p4_bh_post · axial · 3.0mm · 1.25mm/px · z∈[-185,+100]mm · 3 of 96 slices shown (1 of 4)]
[im 1/96]
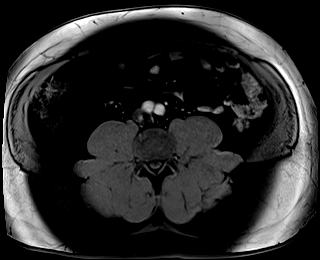
[im 48/96]
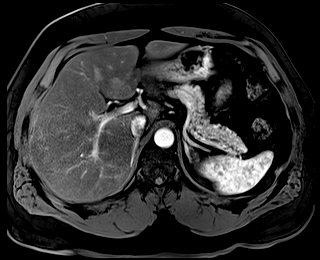
[im 96/96]
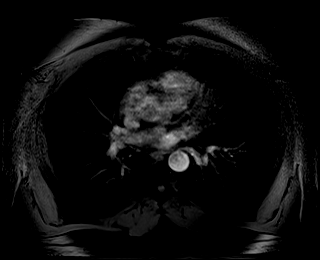

[Series 13: t1_vibe_fs_tra_p4_bh_post_sub · axial · 3.0mm · 1.25mm/px · z∈[-185,+100]mm · 3 of 96 slices shown (1 of 4)]
[im 1/96]
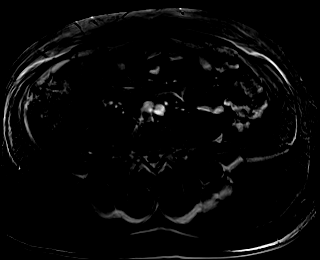
[im 48/96]
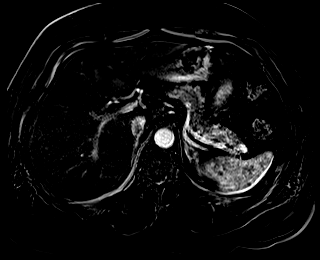
[im 96/96]
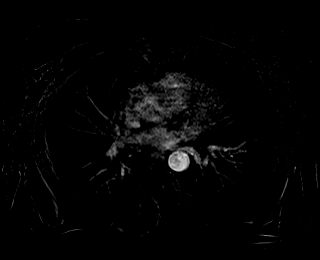

[Series 14: t1_vibe_fs_tra_p4_bh_post · axial · 3.0mm · 1.25mm/px · z∈[-185,+100]mm · 3 of 96 slices shown (2 of 4)]
[im 1/96]
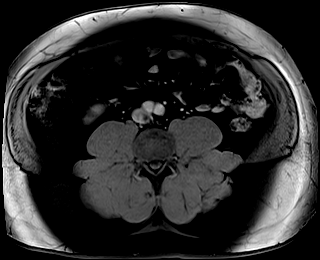
[im 48/96]
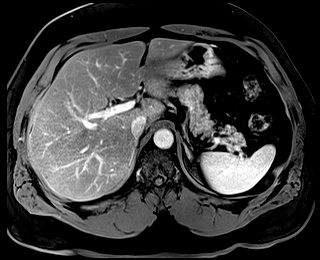
[im 96/96]
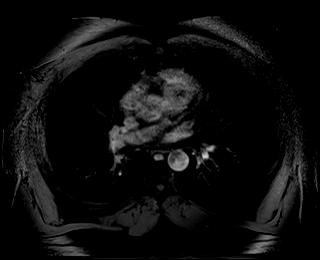

[Series 15: t1_vibe_fs_tra_p4_bh_post_sub · axial · 3.0mm · 1.25mm/px · z∈[-185,+100]mm · 3 of 96 slices shown (2 of 4)]
[im 1/96]
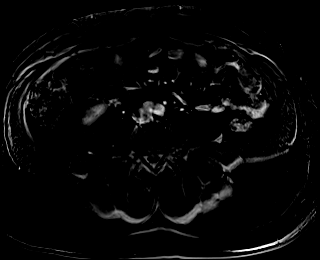
[im 48/96]
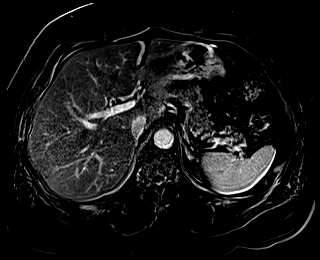
[im 96/96]
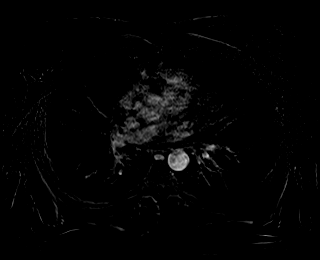

[Series 16: t1_vibe_fs_tra_p4_bh_post · axial · 3.0mm · 1.25mm/px · z∈[-185,+100]mm · 3 of 96 slices shown (3 of 4)]
[im 1/96]
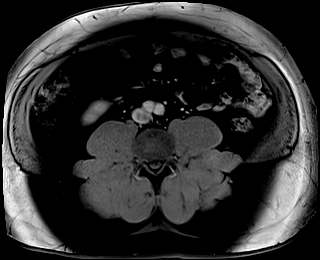
[im 48/96]
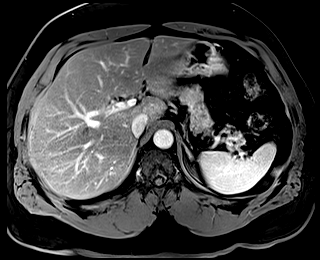
[im 96/96]
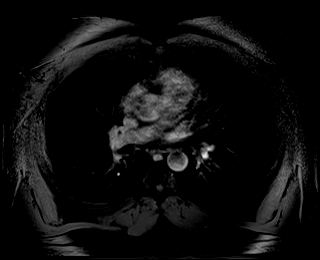

[Series 17: t1_vibe_fs_tra_p4_bh_post_sub · axial · 3.0mm · 1.25mm/px · z∈[-185,+100]mm · 3 of 96 slices shown (3 of 4)]
[im 1/96]
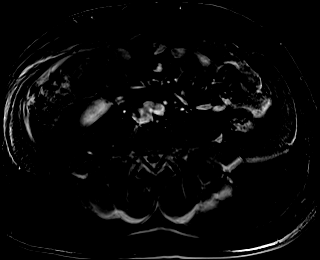
[im 48/96]
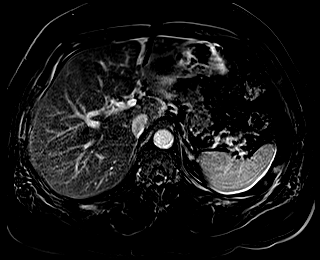
[im 96/96]
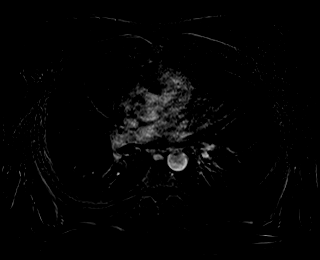

[Series 18: t1_vibe_fs_tra_p4_bh_post · axial · 3.0mm · 1.25mm/px · z∈[-185,+100]mm · 3 of 96 slices shown (4 of 4)]
[im 1/96]
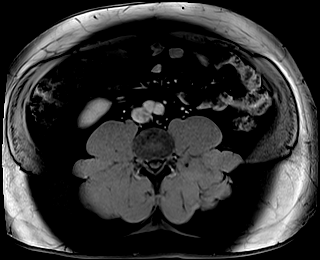
[im 48/96]
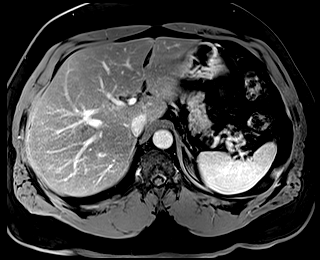
[im 96/96]
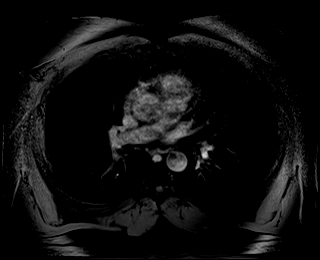

[Series 19: t1_vibe_fs_tra_p4_bh_post_sub · axial · 3.0mm · 1.25mm/px · z∈[-185,+100]mm · 3 of 96 slices shown (4 of 4)]
[im 1/96]
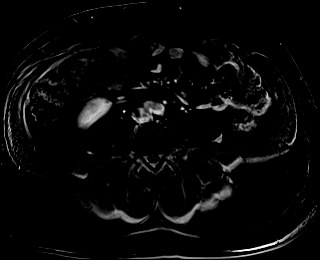
[im 48/96]
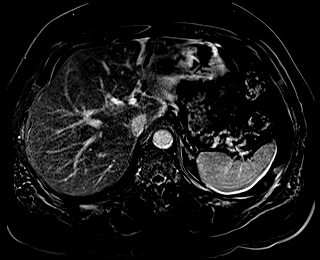
[im 96/96]
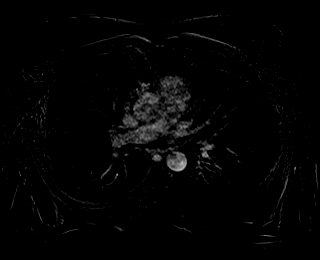

[Series 20: T1 dynamic post-contrast · coronal · 3.0mm · 1.41mm/px · 3 of 80 slices shown]
[im 1/80]
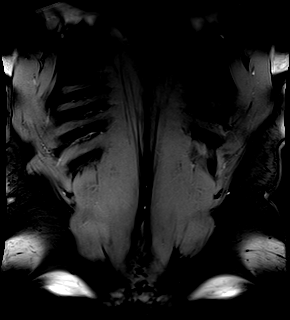
[im 40/80]
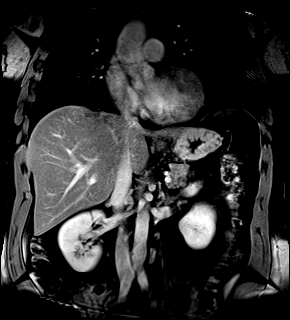
[im 80/80]
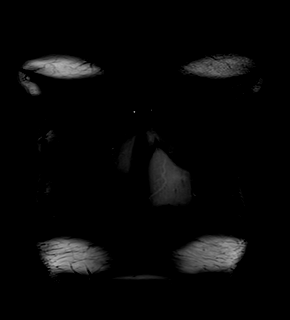

[Series 1023: out of phase · axial · 3.0mm · 1.25mm/px · z∈[-185,+100]mm · 3 of 96 slices shown]
[im 1/96]
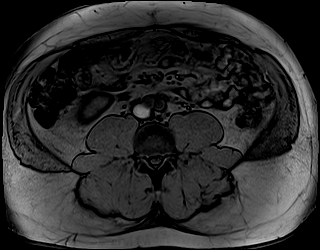
[im 48/96]
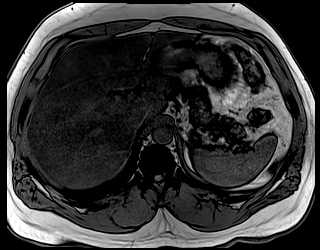
[im 96/96]
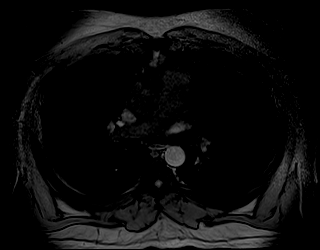

[Series 1030: in phase · axial · 3.0mm · 1.25mm/px · z∈[-185,+100]mm · 3 of 96 slices shown]
[im 1/96]
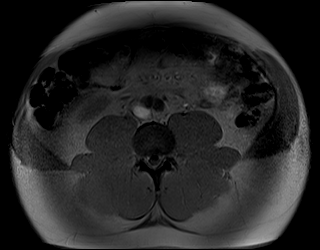
[im 48/96]
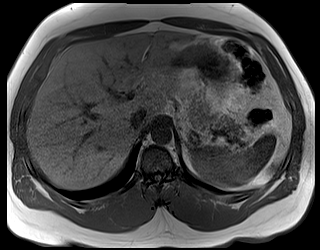
[im 96/96]
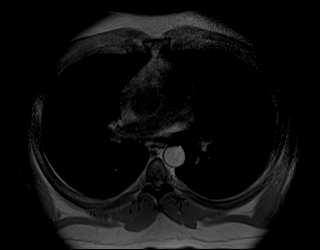

[48 of 48 positions shown; findings below may reference images not displayed]

FINDINGS: Lower chest: No acute findings.

Hepatobiliary: Moderate diffuse hepatic steatosis is demonstrated.
Several areas of focal fatty sparing are seen near the gallbladder
fossa, which correspond with the liver lesions seen on recent
ultrasound. No liver masses are identified. Gallbladder is
unremarkable. No evidence of biliary ductal dilatation.

Pancreas:  No mass or inflammatory changes.

Spleen:  Within normal limits in size and appearance.

Adrenals/Urinary Tract: No masses identified. No evidence of
hydronephrosis.

Stomach/Bowel: Visualized portion unremarkable.

Vascular/Lymphatic: No pathologically enlarged lymph nodes
identified. No abdominal aortic aneurysm.

Other:  None.

Musculoskeletal:  No suspicious bone lesions identified.
IMPRESSION: Moderate diffuse hepatic steatosis. Several areas of focal fatty
sparing correspond with liver lesions seen on recent ultrasound.

No evidence of hepatic neoplasm or other acute findings.

## 2021-03-29 ENCOUNTER — Encounter: Payer: Self-pay | Admitting: Internal Medicine

## 2021-05-16 ENCOUNTER — Ambulatory Visit: Payer: BLUE CROSS/BLUE SHIELD

## 2021-08-28 ENCOUNTER — Ambulatory Visit: Payer: Self-pay

## 2021-11-29 ENCOUNTER — Other Ambulatory Visit: Payer: Self-pay

## 2021-11-29 ENCOUNTER — Encounter (HOSPITAL_COMMUNITY): Payer: Self-pay

## 2021-11-29 ENCOUNTER — Emergency Department (HOSPITAL_COMMUNITY): Payer: PRIVATE HEALTH INSURANCE

## 2021-11-29 ENCOUNTER — Inpatient Hospital Stay (HOSPITAL_COMMUNITY)
Admission: EM | Admit: 2021-11-29 | Discharge: 2021-12-02 | DRG: 247 | Disposition: A | Discharge status: Home / Self Care | Payer: PRIVATE HEALTH INSURANCE | Attending: Cardiovascular Disease | Admitting: Cardiovascular Disease

## 2021-11-29 DIAGNOSIS — E785 Hyperlipidemia, unspecified: Secondary | ICD-10-CM

## 2021-11-29 DIAGNOSIS — R7303 Prediabetes: Secondary | ICD-10-CM | POA: Diagnosis present

## 2021-11-29 DIAGNOSIS — I10 Essential (primary) hypertension: Secondary | ICD-10-CM | POA: Diagnosis present

## 2021-11-29 DIAGNOSIS — I159 Secondary hypertension, unspecified: Secondary | ICD-10-CM | POA: Diagnosis present

## 2021-11-29 DIAGNOSIS — I249 Acute ischemic heart disease, unspecified: Secondary | ICD-10-CM | POA: Diagnosis present

## 2021-11-29 DIAGNOSIS — K760 Fatty (change of) liver, not elsewhere classified: Secondary | ICD-10-CM | POA: Diagnosis present

## 2021-11-29 DIAGNOSIS — Z6839 Body mass index (BMI) 39.0-39.9, adult: Secondary | ICD-10-CM

## 2021-11-29 DIAGNOSIS — E876 Hypokalemia: Secondary | ICD-10-CM

## 2021-11-29 DIAGNOSIS — I214 Non-ST elevation (NSTEMI) myocardial infarction: Principal | ICD-10-CM | POA: Diagnosis present

## 2021-11-29 DIAGNOSIS — I255 Ischemic cardiomyopathy: Secondary | ICD-10-CM | POA: Diagnosis present

## 2021-11-29 DIAGNOSIS — Z79899 Other long term (current) drug therapy: Secondary | ICD-10-CM

## 2021-11-29 DIAGNOSIS — R0789 Other chest pain: Secondary | ICD-10-CM | POA: Diagnosis present

## 2021-11-29 DIAGNOSIS — F17290 Nicotine dependence, other tobacco product, uncomplicated: Secondary | ICD-10-CM | POA: Diagnosis present

## 2021-11-29 DIAGNOSIS — Z955 Presence of coronary angioplasty implant and graft: Secondary | ICD-10-CM

## 2021-11-29 LAB — CBC WITH DIFFERENTIAL/PLATELET
Abs Immature Granulocytes: 0.04 10*3/uL (ref 0.00–0.07)
Basophils Absolute: 0.1 10*3/uL (ref 0.0–0.1)
Basophils Relative: 1 %
Eosinophils Absolute: 0.1 10*3/uL (ref 0.0–0.5)
Eosinophils Relative: 1 %
HCT: 45 % (ref 39.0–52.0)
Hemoglobin: 15.8 g/dL (ref 13.0–17.0)
Immature Granulocytes: 0 %
Lymphocytes Relative: 17 %
Lymphs Abs: 1.9 10*3/uL (ref 0.7–4.0)
MCH: 29.5 pg (ref 26.0–34.0)
MCHC: 35.1 g/dL (ref 30.0–36.0)
MCV: 84.1 fL (ref 80.0–100.0)
Monocytes Absolute: 0.7 10*3/uL (ref 0.1–1.0)
Monocytes Relative: 6 %
Neutro Abs: 8.5 10*3/uL — ABNORMAL HIGH (ref 1.7–7.7)
Neutrophils Relative %: 75 %
Platelets: 264 10*3/uL (ref 150–400)
RBC: 5.35 MIL/uL (ref 4.22–5.81)
RDW: 14.1 % (ref 11.5–15.5)
WBC: 11.4 10*3/uL — ABNORMAL HIGH (ref 4.0–10.5)
nRBC: 0 % (ref 0.0–0.2)

## 2021-11-29 LAB — URINALYSIS, ROUTINE W REFLEX MICROSCOPIC
Bilirubin Urine: NEGATIVE
Glucose, UA: NEGATIVE mg/dL
Hgb urine dipstick: NEGATIVE
Ketones, ur: 20 mg/dL — AB
Leukocytes,Ua: NEGATIVE
Nitrite: NEGATIVE
Protein, ur: NEGATIVE mg/dL
Specific Gravity, Urine: 1.015 (ref 1.005–1.030)
pH: 6 (ref 5.0–8.0)

## 2021-11-29 LAB — COMPREHENSIVE METABOLIC PANEL
ALT: 38 U/L (ref 0–44)
AST: 31 U/L (ref 15–41)
Albumin: 4.5 g/dL (ref 3.5–5.0)
Alkaline Phosphatase: 100 U/L (ref 38–126)
Anion gap: 10 (ref 5–15)
BUN: 13 mg/dL (ref 6–20)
CO2: 23 mmol/L (ref 22–32)
Calcium: 9.7 mg/dL (ref 8.9–10.3)
Chloride: 106 mmol/L (ref 98–111)
Creatinine, Ser: 0.89 mg/dL (ref 0.61–1.24)
GFR, Estimated: >60 mL/min (ref >60)
Glucose, Bld: 139 mg/dL — ABNORMAL HIGH (ref 70–99)
Potassium: 3.3 mmol/L — ABNORMAL LOW (ref 3.5–5.1)
Sodium: 139 mmol/L (ref 135–145)
Total Bilirubin: 0.7 mg/dL (ref 0.3–1.2)
Total Protein: 8.3 g/dL — ABNORMAL HIGH (ref 6.5–8.1)

## 2021-11-29 LAB — TROPONIN I (HIGH SENSITIVITY): Troponin I (High Sensitivity): 95 ng/L — ABNORMAL HIGH (ref <18)

## 2021-11-29 MED ORDER — NITROGLYCERIN IN D5W 200-5 MCG/ML-% IV SOLN
5.0000 ug/min | INTRAVENOUS | Status: DC
  Administered 2021-11-30: 5 ug/min via INTRAVENOUS
  Filled 2021-11-29: qty 250

## 2021-11-29 MED ORDER — ASPIRIN 81 MG PO CHEW
324.0000 mg | CHEWABLE_TABLET | Freq: Once | ORAL | Status: DC
  Filled 2021-11-29: qty 4

## 2021-11-29 MED ORDER — SODIUM CHLORIDE 0.9 % IV SOLN: INTRAVENOUS | Status: DC

## 2021-11-29 MED ORDER — PANTOPRAZOLE SODIUM 40 MG IV SOLR
40.0000 mg | Freq: Once | INTRAVENOUS | Status: AC
  Administered 2021-11-29: 40 mg via INTRAVENOUS
  Filled 2021-11-29: qty 10

## 2021-11-29 MED ORDER — ASPIRIN 81 MG PO CHEW
324.0000 mg | CHEWABLE_TABLET | Freq: Once | ORAL | Status: AC
  Administered 2021-11-29: 243 mg via ORAL
  Filled 2021-11-29: qty 4

## 2021-11-29 MED ORDER — ALUM & MAG HYDROXIDE-SIMETH 200-200-20 MG/5ML PO SUSP
30.0000 mL | Freq: Once | ORAL | Status: AC
  Administered 2021-11-29: 30 mL via ORAL
  Filled 2021-11-29: qty 30

## 2021-11-29 MED ORDER — POTASSIUM CHLORIDE CRYS ER 20 MEQ PO TBCR
40.0000 meq | EXTENDED_RELEASE_TABLET | Freq: Once | ORAL | Status: AC
  Administered 2021-11-30: 40 meq via ORAL
  Filled 2021-11-29: qty 2

## 2021-11-29 MED ORDER — HEPARIN (PORCINE) 25000 UT/250ML-% IV SOLN
1600.0000 [IU]/h | INTRAVENOUS | Status: DC
  Administered 2021-11-29: 1600 [IU]/h via INTRAVENOUS
  Filled 2021-11-29: qty 250

## 2021-11-29 MED ORDER — HEPARIN BOLUS VIA INFUSION
4000.0000 [IU] | Freq: Once | INTRAVENOUS | Status: AC
Start: 2021-11-29 — End: 2021-11-29
  Administered 2021-11-29: 4000 [IU] via INTRAVENOUS

## 2021-11-29 MED ORDER — ONDANSETRON HCL 4 MG/2ML IJ SOLN
4.0000 mg | Freq: Once | INTRAMUSCULAR | Status: AC
  Administered 2021-11-29: 4 mg via INTRAVENOUS
  Filled 2021-11-29: qty 2

## 2021-11-29 NOTE — ED Provider Notes (Signed)
[facility] EMERGENCY DEPARTMENT Provider Note   CSN: [id number] Arrival date & time: 11/29/21  2100     History {Add pertinent medical, surgical, social history, OB history to HPI:1} Chief Complaint  Patient presents with   Abdominal Pain    Benjamin Huang is a 51 y.o. male.  HPI He presents for evaluation of upper abdominal pain about 3 PM today.  The pain has been intermittent, transiently improving with Tums, but now is persistent over the last hour.  He does not have chronic symptoms of a similar nature.  He denies shortness of breath, cough, dizziness or weakness.  He denies exertion.    Home Medications Prior to Admission medications   Medication Sig Start Date End Date Taking? Authorizing Provider  amLODipine (NORVASC) 10 MG tablet Take 10 mg by mouth daily.    [provider]  hydrochlorothiazide (HYDRODIURIL) 25 MG tablet Take 25 mg by mouth daily.    [provider]  losartan (COZAAR) 50 MG tablet Take 1 tablet by mouth daily.    [provider]  Omega-3 Fatty Acids (FISH OIL PO) Take by mouth as needed.    [provider]      Allergies    Patient has no known allergies.    Review of Systems   Review of Systems  Physical Exam Updated Vital Signs BP (!) 159/96   Pulse 96   Temp 98.4 F (36.9 C) (Oral)   Resp 18   Ht 6' (1.829 m)   Wt 131.5 kg   SpO2 100%   BMI 39.33 kg/m  Physical Exam Vitals and nursing note reviewed.  Constitutional:      General: He is not in acute distress.    Appearance: He is well-developed. He is not ill-appearing or diaphoretic.  HENT:     Head: Normocephalic and atraumatic.     Right Ear: External ear normal.     Left Ear: External ear normal.  Eyes:     Conjunctiva/sclera: Conjunctivae normal.     Pupils: Pupils are equal, round, and reactive to light.  Neck:     Trachea: Phonation normal.  Cardiovascular:     Rate and Rhythm: Normal rate.  Pulmonary:     Effort: Pulmonary effort  is normal. No respiratory distress.     Breath sounds: No stridor.  Abdominal:     General: There is no distension.     Palpations: Abdomen is soft.     Tenderness: There is no abdominal tenderness.     Comments: Frequent belching noted during exam.  Musculoskeletal:        General: Normal range of motion.     Cervical back: Normal range of motion and neck supple.  Skin:    General: Skin is warm and dry.  Neurological:     Mental Status: He is alert and oriented to person, place, and time.     Cranial Nerves: No cranial nerve deficit.     Sensory: No sensory deficit.     Motor: No abnormal muscle tone.     Coordination: Coordination normal.  Psychiatric:        Mood and Affect: Mood normal.        Behavior: Behavior normal.        Thought Content: Thought content normal.        Judgment: Judgment normal.     ED Results / Procedures / Treatments   Labs (all labs ordered are listed, but only abnormal results are displayed) Labs  Reviewed - No data to display  EKG EKG Interpretation  Date/Time:  Wednesday November 29 2021 21:26:10 EDT Ventricular Rate:  94 PR Interval:  226 QRS Duration: 119 QT Interval:  379 QTC Calculation: 474 R Axis:   -1 Text Interpretation: Sinus rhythm Prolonged PR interval Probable left atrial enlargement Incomplete right bundle branch block ST depr, consider ischemia, inferior leads Minimal ST elevation, lateral leads Since last tracing ST abnormality is new Otherwise no significant change Confirmed by Mancel Bale [phone]) on 11/29/2021 9:30:47 PM  Radiology No results found.  Procedures Procedures  {Document cardiac monitor, telemetry assessment procedure when appropriate:1}  Medications Ordered in ED Medications - No data to display  ED Course/ Medical Decision Making/ A&P                           Medical Decision Making  ***  {Document critical care time when appropriate:1} {Document review of labs and clinical decision tools ie  heart score, Chads2Vasc2 etc:1}  {Document your independent review of radiology images, and any outside records:1} {Document your discussion with family members, caretakers, and with consultants:1} {Document social determinants of health affecting pt's care:1} {Document your decision making why or why not admission, treatments were needed:1} Final Clinical Impression(s) / ED Diagnoses Final diagnoses:  None    Rx / DC Orders ED Discharge Orders     None

## 2021-11-29 NOTE — ED Notes (Signed)
EKG done and seen by Dr Wentz 

## 2021-11-29 NOTE — ED Triage Notes (Addendum)
Pt presents with abdominal that started today. Pt restlessness in triage and states that he feels like it is acid reflux. Denies chest pain. Also complaining of tingling and numbness down right arm that started approx 1 hour ago.  Pt burping and dry heaving in triage.

## 2021-11-29 NOTE — ED Provider Notes (Incomplete)
[facility] EMERGENCY DEPARTMENT Provider Note   CSN: [id number] Arrival date & time: 11/29/21  2100     History {Add pertinent medical, surgical, social history, OB history to HPI:1} Chief Complaint  Patient presents with  . Abdominal Pain    Benjamin Huang is a 51 y.o. male.  HPI He presents for evaluation of upper abdominal pain about 3 PM today.  The pain has been intermittent, transiently improving with Tums, but now is persistent over the last hour.  He does not have chronic symptoms of a similar nature.  He denies shortness of breath, cough, dizziness or weakness.  He denies exertion.    Home Medications Prior to Admission medications   Medication Sig Start Date End Date Taking? Authorizing Provider  amLODipine (NORVASC) 10 MG tablet Take 10 mg by mouth daily.    [provider]  hydrochlorothiazide (HYDRODIURIL) 25 MG tablet Take 25 mg by mouth daily.    [provider]  losartan (COZAAR) 50 MG tablet Take 1 tablet by mouth daily.    [provider]  Omega-3 Fatty Acids (FISH OIL PO) Take by mouth as needed.    [provider]      Allergies    Patient has no known allergies.    Review of Systems   Review of Systems  Physical Exam Updated Vital Signs BP (!) 164/98   Pulse 96   Temp 98.4 F (36.9 C) (Oral)   Resp (!) 21   Ht 6' (1.829 m)   Wt 131.5 kg   SpO2 99%   BMI 39.33 kg/m  Physical Exam Vitals and nursing note reviewed.  Constitutional:      General: He is not in acute distress.    Appearance: He is well-developed. He is not ill-appearing or diaphoretic.  HENT:     Head: Normocephalic and atraumatic.     Right Ear: External ear normal.     Left Ear: External ear normal.  Eyes:     Conjunctiva/sclera: Conjunctivae normal.     Pupils: Pupils are equal, round, and reactive to light.  Neck:     Trachea: Phonation normal.  Cardiovascular:     Rate and Rhythm: Normal rate.  Pulmonary:     Effort: Pulmonary  effort is normal. No respiratory distress.     Breath sounds: No stridor.  Abdominal:     General: There is no distension.     Palpations: Abdomen is soft.     Tenderness: There is no abdominal tenderness.     Comments: Frequent belching noted during exam.  Musculoskeletal:        General: Normal range of motion.     Cervical back: Normal range of motion and neck supple.  Skin:    General: Skin is warm and dry.  Neurological:     Mental Status: He is alert and oriented to person, place, and time.     Cranial Nerves: No cranial nerve deficit.     Sensory: No sensory deficit.     Motor: No abnormal muscle tone.     Coordination: Coordination normal.  Psychiatric:        Mood and Affect: Mood normal.        Behavior: Behavior normal.        Thought Content: Thought content normal.        Judgment: Judgment normal.     ED Results / Procedures / Treatments   Labs (all labs ordered are listed, but only abnormal results are displayed)  Labs Reviewed  COMPREHENSIVE METABOLIC PANEL - Abnormal; Notable for the following components:      Result Value   Potassium 3.3 (*)    Glucose, Bld 139 (*)    Total Protein 8.3 (*)    All other components within normal limits  CBC WITH DIFFERENTIAL/PLATELET - Abnormal; Notable for the following components:   WBC 11.4 (*)    Neutro Abs 8.5 (*)    All other components within normal limits  URINALYSIS, ROUTINE W REFLEX MICROSCOPIC - Abnormal; Notable for the following components:   APPearance CLOUDY (*)    Ketones, ur 20 (*)    All other components within normal limits  TROPONIN I (HIGH SENSITIVITY) - Abnormal; Notable for the following components:   Troponin I (High Sensitivity) 95 (*)    All other components within normal limits  HEPARIN LEVEL (UNFRACTIONATED)  MAGNESIUM  TROPONIN I (HIGH SENSITIVITY)    EKG EKG Interpretation  Date/Time:  Wednesday November 29 2021 23:18:51 EDT Ventricular Rate:  95 PR Interval:  182 QRS  Duration: 114 QT Interval:  337 QTC Calculation: 424 R Axis:   -41 Text Interpretation: Sinus rhythm Incomplete right bundle branch block Since last tracing of earlier today No significant change was found Confirmed by Mancel Bale [phone]) on 11/29/2021 11:25:09 PM  Radiology DG Chest Port 1 View  Result Date: 11/29/2021 CLINICAL DATA:  Chest pain EXAM: PORTABLE CHEST 1 VIEW COMPARISON:  None Available. FINDINGS: The heart size and mediastinal contours are within normal limits. Both lungs are clear. The visualized skeletal structures are unremarkable. IMPRESSION: No active disease. Electronically Signed   By: Helyn Numbers M.D.   On: 11/29/2021 22:03    Procedures Procedures  {Document cardiac monitor, telemetry assessment procedure when appropriate:1}  Medications Ordered in ED Medications  0.9 %  sodium chloride infusion ( Intravenous New Bag/Given 11/29/21 2231)  aspirin chewable tablet 324 mg (324 mg Oral Not Given 11/29/21 2338)  heparin ADULT infusion 100 units/mL (25000 units/233mL) (1,600 Units/hr Intravenous New Bag/Given 11/29/21 2337)  nitroGLYCERIN 50 mg in dextrose 5 % 250 mL (0.2 mg/mL) infusion (has no administration in time range)  potassium chloride SA (KLOR-CON M) CR tablet 40 mEq (has no administration in time range)  aspirin chewable tablet 324 mg (243 mg Oral Given 11/29/21 2220)  alum & mag hydroxide-simeth (MAALOX/MYLANTA) 200-200-20 MG/5ML suspension 30 mL (30 mLs Oral Given 11/29/21 2219)  pantoprazole (PROTONIX) injection 40 mg (40 mg Intravenous Given 11/29/21 2226)  ondansetron (ZOFRAN) injection 4 mg (4 mg Intravenous Given 11/29/21 2227)  heparin bolus via infusion 4,000 Units (4,000 Units Intravenous Bolus from Bag 11/29/21 2337)    ED Course/ Medical Decision Making/ A&P Clinical Course as of 11/29/21 2357  Wed Nov 29, 2021  2355 At this time he reports that his pain is 5/10. [EW]    Clinical Course User Index [EW] Mancel Bale, MD                            Medical Decision Making Patient presenting with epigastric pain, started without provocation today.  No prior history of cardiac disease.  Initial EKG worrisome with T wave inversion inferiorly.  No evidence for ST segment elevation MI.  Initial troponin high.  Patient improved with rest.  Aspirin and heparin started.  Case discussed with cardiologist who recommends admission by medicine service and they will consult.  He suspects this is a type II NSTEMI.  Patient states he has  been taking his blood pressure medicine regularly.  He is hypertensive in the emergency department.  Problems Addressed: Hypokalemia: acute illness or injury that poses a threat to life or bodily functions NSTEMI (non-ST elevated myocardial infarction) [facility]): complicated acute illness or injury that poses a threat to life or bodily functions    Details: No prior known cardiac disease  Amount and/or Complexity of Data Reviewed Independent Historian:     Details: He is a cogent historian Labs: ordered.    Details: CBC, metabolic panel, troponin, repeat troponin Radiology: ordered and independent interpretation performed.    Details: Chest x-ray-no edema or infiltrate Discussion of management or test interpretation with external provider(s): Case discussed with cardiologist, Dr. Hector Brunswick.  He will see as a Research scientist (medical) after transfer to [facility] by the medicine service  Risk OTC drugs. Prescription drug management. Decision regarding hospitalization. Risk Details: Patient presenting with epigastric pain, and abnormal EKG.  Found to have elevated troponin initially.  Etiology not clear.  Patient treated as NSTEMI, initiation of treatment with aspirin and heparin.  Initial blood pressure high.  Critical Care Total time providing critical care: 45 minutes     {Document critical care time when appropriate:1} {Document review of labs and clinical decision tools ie heart score, Chads2Vasc2 etc:1}  {Document your  independent review of radiology images, and any outside records:1} {Document your discussion with family members, caretakers, and with consultants:1} {Document social determinants of health affecting pt's care:1} {Document your decision making why or why not admission, treatments were needed:1} Final Clinical Impression(s) / ED Diagnoses Final diagnoses:  NSTEMI (non-ST elevated myocardial infarction) (HCC)  Hypokalemia  Secondary hypertension    Rx / DC Orders ED Discharge Orders     None

## 2021-11-29 NOTE — Progress Notes (Signed)
ANTICOAGULATION CONSULT NOTE - Initial Consult  Pharmacy Consult for Heparin Indication: chest pain/ACS  No Known Allergies  Patient Measurements: Height: 6' (182.9 cm) Weight: 131.5 kg (290 lb) IBW/kg (Calculated) : 77.6 Heparin Dosing Weight: 110 kg  Vital Signs: Temp: 98.4 F (36.9 C) (08/09 2116) Temp Source: Oral (08/09 2116) BP: 164/98 (08/09 2300) Pulse Rate: 96 (08/09 2300)  Labs: Recent Labs    11/29/21 2209  HGB 15.8  HCT 45.0  PLT 264  CREATININE 0.89  TROPONINIHS 95*    Estimated Creatinine Clearance: 139.3 mL/min (by C-G formula based on SCr of 0.89 mg/dL).   Medical History: Past Medical History:  Diagnosis Date   HTN (hypertension)    Obesity, morbid, BMI 40.0-49.9 (HCC)     Medications:  No current facility-administered medications on file prior to encounter.   Current Outpatient Medications on File Prior to Encounter  Medication Sig Dispense Refill   amLODipine (NORVASC) 10 MG tablet Take 10 mg by mouth daily.     hydrochlorothiazide (HYDRODIURIL) 25 MG tablet Take 25 mg by mouth daily.     losartan (COZAAR) 50 MG tablet Take 1 tablet by mouth daily.     Omega-3 Fatty Acids (FISH OIL PO) Take by mouth as needed.       Assessment: 51 y.o. male with elevated troponin, possible ACS, for heparin  Goal of Therapy:  Heparin level 0.3-0.7 units/ml Monitor platelets by anticoagulation protocol: Yes   Plan:  Heparin 4000 units IV bolus, then start heparin 1600 units/hr Check heparin level in 6 hours.    Benjamin Huang 11/29/2021,11:12 PM

## 2021-11-30 ENCOUNTER — Encounter (HOSPITAL_COMMUNITY): Payer: Self-pay | Admitting: Internal Medicine

## 2021-11-30 ENCOUNTER — Telehealth (HOSPITAL_COMMUNITY): Payer: Self-pay | Admitting: Pharmacy Technician

## 2021-11-30 ENCOUNTER — Other Ambulatory Visit (HOSPITAL_COMMUNITY): Payer: Self-pay

## 2021-11-30 ENCOUNTER — Inpatient Hospital Stay (HOSPITAL_COMMUNITY): Payer: PRIVATE HEALTH INSURANCE

## 2021-11-30 ENCOUNTER — Encounter (HOSPITAL_COMMUNITY)
Admission: EM | Disposition: A | Discharge status: Home / Self Care | Payer: Self-pay | Source: Home / Self Care | Attending: Cardiovascular Disease

## 2021-11-30 DIAGNOSIS — I249 Acute ischemic heart disease, unspecified: Secondary | ICD-10-CM

## 2021-11-30 DIAGNOSIS — E785 Hyperlipidemia, unspecified: Secondary | ICD-10-CM | POA: Diagnosis present

## 2021-11-30 DIAGNOSIS — F17290 Nicotine dependence, other tobacco product, uncomplicated: Secondary | ICD-10-CM | POA: Diagnosis present

## 2021-11-30 DIAGNOSIS — I2510 Atherosclerotic heart disease of native coronary artery without angina pectoris: Secondary | ICD-10-CM | POA: Diagnosis not present

## 2021-11-30 DIAGNOSIS — I159 Secondary hypertension, unspecified: Secondary | ICD-10-CM | POA: Diagnosis present

## 2021-11-30 DIAGNOSIS — Z6839 Body mass index (BMI) 39.0-39.9, adult: Secondary | ICD-10-CM | POA: Diagnosis not present

## 2021-11-30 DIAGNOSIS — I10 Essential (primary) hypertension: Secondary | ICD-10-CM | POA: Diagnosis not present

## 2021-11-30 DIAGNOSIS — I255 Ischemic cardiomyopathy: Secondary | ICD-10-CM | POA: Diagnosis present

## 2021-11-30 DIAGNOSIS — I214 Non-ST elevation (NSTEMI) myocardial infarction: Principal | ICD-10-CM

## 2021-11-30 DIAGNOSIS — Z720 Tobacco use: Secondary | ICD-10-CM | POA: Diagnosis not present

## 2021-11-30 DIAGNOSIS — R079 Chest pain, unspecified: Secondary | ICD-10-CM | POA: Diagnosis not present

## 2021-11-30 DIAGNOSIS — K760 Fatty (change of) liver, not elsewhere classified: Secondary | ICD-10-CM | POA: Diagnosis present

## 2021-11-30 DIAGNOSIS — R7303 Prediabetes: Secondary | ICD-10-CM | POA: Diagnosis present

## 2021-11-30 DIAGNOSIS — E876 Hypokalemia: Secondary | ICD-10-CM | POA: Diagnosis present

## 2021-11-30 DIAGNOSIS — Z79899 Other long term (current) drug therapy: Secondary | ICD-10-CM | POA: Diagnosis not present

## 2021-11-30 DIAGNOSIS — R0789 Other chest pain: Secondary | ICD-10-CM | POA: Insufficient documentation

## 2021-11-30 HISTORY — PX: CORONARY STENT INTERVENTION: CATH118234

## 2021-11-30 HISTORY — PX: LEFT HEART CATH AND CORONARY ANGIOGRAPHY: CATH118249

## 2021-11-30 HISTORY — PX: CORONARY/GRAFT ACUTE MI REVASCULARIZATION: CATH118305

## 2021-11-30 LAB — MAGNESIUM: Magnesium: 2 mg/dL (ref 1.7–2.4)

## 2021-11-30 LAB — ECHOCARDIOGRAM COMPLETE
AR max vel: 4.25 cm2
AV Area VTI: 4.16 cm2
AV Area mean vel: 4.11 cm2
AV Mean grad: 3 mmHg
AV Peak grad: 5.5 mmHg
Ao pk vel: 1.17 m/s
Area-P 1/2: 4.71 cm2
Height: 72 in
S' Lateral: 3.1 cm
Weight: 4640 oz

## 2021-11-30 LAB — POCT ACTIVATED CLOTTING TIME
Activated Clotting Time: 227 seconds
Activated Clotting Time: 281 seconds

## 2021-11-30 LAB — LIPID PANEL
Cholesterol: 184 mg/dL (ref 0–200)
HDL: 36 mg/dL — ABNORMAL LOW (ref >40)
LDL Cholesterol: 133 mg/dL — ABNORMAL HIGH (ref 0–99)
Total CHOL/HDL Ratio: 5.1 RATIO
Triglycerides: 74 mg/dL (ref <150)
VLDL: 15 mg/dL (ref 0–40)

## 2021-11-30 LAB — HIV ANTIBODY (ROUTINE TESTING W REFLEX): HIV Screen 4th Generation wRfx: NONREACTIVE

## 2021-11-30 LAB — HEMOGLOBIN A1C
Hgb A1c MFr Bld: 5.2 % (ref 4.8–5.6)
Mean Plasma Glucose: 102.54 mg/dL

## 2021-11-30 LAB — TROPONIN I (HIGH SENSITIVITY)
Troponin I (High Sensitivity): 665 ng/L (ref <18)
Troponin I (High Sensitivity): 2406 ng/L (ref <18)

## 2021-11-30 SURGERY — CORONARY/GRAFT ACUTE MI REVASCULARIZATION
Anesthesia: LOCAL

## 2021-11-30 MED ORDER — CHLORHEXIDINE GLUCONATE CLOTH 2 % EX PADS
6.0000 | MEDICATED_PAD | Freq: Every day | CUTANEOUS | Status: DC
  Administered 2021-12-01 – 2021-12-02 (×2): 6 via TOPICAL

## 2021-11-30 MED ORDER — ACETAMINOPHEN 325 MG PO TABS: 650.0000 mg | ORAL_TABLET | ORAL | Status: DC | PRN

## 2021-11-30 MED ORDER — SODIUM CHLORIDE 0.9% FLUSH
3.0000 mL | Freq: Two times a day (BID) | INTRAVENOUS | Status: DC
Start: 2021-11-30 — End: 2021-12-02
  Administered 2021-11-30 – 2021-12-02 (×5): 3 mL via INTRAVENOUS

## 2021-11-30 MED ORDER — TICAGRELOR 90 MG PO TABS
ORAL_TABLET | ORAL | Status: AC
  Filled 2021-11-30: qty 2

## 2021-11-30 MED ORDER — HEPARIN SODIUM (PORCINE) 1000 UNIT/ML IJ SOLN
INTRAMUSCULAR | Status: AC
  Filled 2021-11-30: qty 10

## 2021-11-30 MED ORDER — ASPIRIN 81 MG PO CHEW
81.0000 mg | CHEWABLE_TABLET | Freq: Every day | ORAL | Status: DC
  Administered 2021-12-01 – 2021-12-02 (×2): 81 mg via ORAL
  Filled 2021-11-30 (×2): qty 1

## 2021-11-30 MED ORDER — FENTANYL CITRATE PF 50 MCG/ML IJ SOSY
50.0000 ug | PREFILLED_SYRINGE | Freq: Once | INTRAMUSCULAR | Status: DC
  Filled 2021-11-30: qty 1

## 2021-11-30 MED ORDER — ROSUVASTATIN CALCIUM 20 MG PO TABS
40.0000 mg | ORAL_TABLET | Freq: Every day | ORAL | Status: DC
  Administered 2021-12-01 – 2021-12-02 (×2): 40 mg via ORAL
  Filled 2021-11-30 (×2): qty 2

## 2021-11-30 MED ORDER — FENTANYL CITRATE PF 50 MCG/ML IJ SOSY
50.0000 ug | PREFILLED_SYRINGE | INTRAMUSCULAR | Status: DC | PRN
  Administered 2021-11-30 (×2): 50 ug via INTRAVENOUS
  Filled 2021-11-30: qty 1

## 2021-11-30 MED ORDER — LOSARTAN POTASSIUM 50 MG PO TABS
50.0000 mg | ORAL_TABLET | Freq: Every day | ORAL | Status: DC
  Administered 2021-11-30 – 2021-12-01 (×2): 50 mg via ORAL
  Filled 2021-11-30 (×2): qty 1

## 2021-11-30 MED ORDER — NITROGLYCERIN 1 MG/10 ML FOR IR/CATH LAB
INTRA_ARTERIAL | Status: AC
  Filled 2021-11-30: qty 10

## 2021-11-30 MED ORDER — TICAGRELOR 90 MG PO TABS
ORAL_TABLET | ORAL | Status: DC | PRN
  Administered 2021-11-30: 180 mg via ORAL

## 2021-11-30 MED ORDER — ASPIRIN 81 MG PO TBEC: 81.0000 mg | DELAYED_RELEASE_TABLET | Freq: Every day | ORAL | Status: DC

## 2021-11-30 MED ORDER — SODIUM CHLORIDE 0.9 % IV SOLN: 250.0000 mL | INTRAVENOUS | Status: DC | PRN

## 2021-11-30 MED ORDER — LABETALOL HCL 5 MG/ML IV SOLN: 10.0000 mg | INTRAVENOUS | Status: AC | PRN

## 2021-11-30 MED ORDER — ATORVASTATIN CALCIUM 80 MG PO TABS
80.0000 mg | ORAL_TABLET | Freq: Every day | ORAL | Status: DC
  Administered 2021-11-30: 80 mg via ORAL
  Filled 2021-11-30: qty 1

## 2021-11-30 MED ORDER — VERAPAMIL HCL 2.5 MG/ML IV SOLN
INTRAVENOUS | Status: AC
  Filled 2021-11-30: qty 2

## 2021-11-30 MED ORDER — ASPIRIN 81 MG PO CHEW: 324.0000 mg | CHEWABLE_TABLET | ORAL | Status: AC

## 2021-11-30 MED ORDER — ASPIRIN 300 MG RE SUPP: 300.0000 mg | RECTAL | Status: AC

## 2021-11-30 MED ORDER — NITROGLYCERIN 0.4 MG SL SUBL: 0.4000 mg | SUBLINGUAL_TABLET | SUBLINGUAL | Status: DC | PRN

## 2021-11-30 MED ORDER — HEPARIN SODIUM (PORCINE) 1000 UNIT/ML IJ SOLN
INTRAMUSCULAR | Status: DC | PRN
  Administered 2021-11-30 (×2): 6000 [IU] via INTRAVENOUS
  Administered 2021-11-30: 5000 [IU] via INTRAVENOUS

## 2021-11-30 MED ORDER — OMEGA-3-ACID ETHYL ESTERS 1 G PO CAPS
1.0000 g | ORAL_CAPSULE | Freq: Every day | ORAL | Status: DC
  Administered 2021-11-30 – 2021-12-02 (×3): 1 g via ORAL
  Filled 2021-11-30 (×4): qty 1

## 2021-11-30 MED ORDER — AMLODIPINE BESYLATE 10 MG PO TABS
10.0000 mg | ORAL_TABLET | Freq: Every day | ORAL | Status: DC
  Administered 2021-11-30 – 2021-12-02 (×3): 10 mg via ORAL
  Filled 2021-11-30 (×3): qty 1

## 2021-11-30 MED ORDER — ONDANSETRON HCL 4 MG/2ML IJ SOLN: 4.0000 mg | Freq: Four times a day (QID) | INTRAMUSCULAR | Status: DC | PRN

## 2021-11-30 MED ORDER — METOPROLOL TARTRATE 25 MG PO TABS
25.0000 mg | ORAL_TABLET | Freq: Two times a day (BID) | ORAL | Status: DC
  Administered 2021-11-30 – 2021-12-02 (×6): 25 mg via ORAL
  Filled 2021-11-30 (×6): qty 1

## 2021-11-30 MED ORDER — HYDRALAZINE HCL 20 MG/ML IJ SOLN: 10.0000 mg | INTRAMUSCULAR | Status: AC | PRN

## 2021-11-30 MED ORDER — IOHEXOL 350 MG/ML SOLN
INTRAVENOUS | Status: DC | PRN
  Administered 2021-11-30: 165 mL

## 2021-11-30 MED ORDER — TICAGRELOR 90 MG PO TABS
90.0000 mg | ORAL_TABLET | Freq: Two times a day (BID) | ORAL | Status: DC
  Administered 2021-11-30 – 2021-12-02 (×4): 90 mg via ORAL
  Filled 2021-11-30 (×4): qty 1

## 2021-11-30 MED ORDER — HYDROCHLOROTHIAZIDE 25 MG PO TABS
25.0000 mg | ORAL_TABLET | Freq: Every day | ORAL | Status: DC
  Administered 2021-11-30 – 2021-12-02 (×3): 25 mg via ORAL
  Filled 2021-11-30 (×3): qty 1

## 2021-11-30 MED ORDER — VERAPAMIL HCL 2.5 MG/ML IV SOLN
INTRA_ARTERIAL | Status: DC | PRN
  Administered 2021-11-30: 15 mL via INTRA_ARTERIAL

## 2021-11-30 MED ORDER — SODIUM CHLORIDE 0.9% FLUSH: 3.0000 mL | INTRAVENOUS | Status: DC | PRN

## 2021-11-30 MED ORDER — HEPARIN (PORCINE) IN NACL 1000-0.9 UT/500ML-% IV SOLN
INTRAVENOUS | Status: AC
  Filled 2021-11-30: qty 1000

## 2021-11-30 MED ORDER — ROSUVASTATIN CALCIUM 20 MG PO TABS
20.0000 mg | ORAL_TABLET | Freq: Every day | ORAL | Status: DC
Start: 2021-11-30 — End: 2021-11-30

## 2021-11-30 MED ORDER — ENOXAPARIN SODIUM 40 MG/0.4ML IJ SOSY
40.0000 mg | PREFILLED_SYRINGE | INTRAMUSCULAR | Status: DC
  Administered 2021-11-30 – 2021-12-01 (×2): 40 mg via SUBCUTANEOUS
  Filled 2021-11-30 (×2): qty 0.4

## 2021-11-30 MED ORDER — SODIUM CHLORIDE 0.9 % IV SOLN
INTRAVENOUS | Status: DC
Start: 2021-11-30 — End: 2021-11-30

## 2021-11-30 MED ORDER — LIDOCAINE HCL (PF) 1 % IJ SOLN
INTRAMUSCULAR | Status: AC
  Filled 2021-11-30: qty 30

## 2021-11-30 MED ORDER — SODIUM CHLORIDE 0.9 % IV SOLN
INTRAVENOUS | Status: AC
  Administered 2021-11-30: 75 mL/h via INTRAVENOUS

## 2021-11-30 MED ORDER — MORPHINE SULFATE (PF) 2 MG/ML IV SOLN: 2.0000 mg | INTRAVENOUS | Status: DC | PRN

## 2021-11-30 SURGICAL SUPPLY — 22 items
BALL SAPPHIRE NC24 3.25X12 (BALLOONS) ×2
BALLN SAPPHIRE 2.0X12 (BALLOONS) ×2
BALLOON SAPPHIRE 2.0X12 (BALLOONS) IMPLANT
BALLOON SAPPHIRE NC24 3.25X12 (BALLOONS) IMPLANT
CATH INFINITI JR4 5F (CATHETERS) ×1 IMPLANT
CATH OPTITORQUE TIG 4.0 5F (CATHETERS) ×1 IMPLANT
CATH VISTA GUIDE 6FR XBLAD3.0 (CATHETERS) ×1 IMPLANT
DEVICE RAD COMP TR BAND LRG (VASCULAR PRODUCTS) ×1 IMPLANT
GLIDESHEATH SLEND A-KIT 6F 22G (SHEATH) ×1 IMPLANT
GUIDEWIRE INQWIRE 1.5J.035X260 (WIRE) IMPLANT
INQWIRE 1.5J .035X260CM (WIRE) ×2
KIT ENCORE 26 ADVANTAGE (KITS) ×1 IMPLANT
KIT ESSENTIALS PG (KITS) ×1 IMPLANT
KIT HEART LEFT (KITS) ×2 IMPLANT
PACK CARDIAC CATHETERIZATION (CUSTOM PROCEDURE TRAY) ×2 IMPLANT
STENT SYNERGY XD 3.0X16 (Permanent Stent) IMPLANT
SYNERGY XD 3.0X16 (Permanent Stent) ×2 IMPLANT
SYR CONTROL 10ML ANGIOGRAPHIC (SYRINGE) ×1 IMPLANT
TRANSDUCER W/STOPCOCK (MISCELLANEOUS) ×2 IMPLANT
TUBING CIL FLEX 10 FLL-RA (TUBING) ×2 IMPLANT
WIRE ASAHI PROWATER 180CM (WIRE) ×1 IMPLANT
WIRE HI TORQ VERSACORE-J 145CM (WIRE) ×1 IMPLANT

## 2021-11-30 NOTE — ED Notes (Signed)
Dr. Rees at the bedside.  

## 2021-11-30 NOTE — ED Provider Notes (Signed)
[name] apparently awaiting admission to hospitalist. I was not primarily taking care of him until I was notified of his elevated troponin and recurrent chest pain.   It appears that his troponin has gone from 90's to 600's. Repeat ECG is unchanged from a couple hours ago but both are improved over initial one 4 hours ago. Already on heparin. Getting his NTG titrated up. Will add on PRN fentanyl. Will d/w hospitalist regarding treatment plan.   Discussed with Dr. Hector Brunswick and updated on results. Recommends a dose of PO 25mg  metoprolol which has already been ordered. Ntg being titrated. Pain improving.   D/w Dr. for ED to ED transfer pending cards consult.   CRITICAL CARE Performed by: Eudelia Bunch Total critical care time: 31 minutes Critical care time was exclusive of separately billable procedures and treating other patients. Critical care was necessary to treat or prevent imminent or life-threatening deterioration. Critical care was time spent personally by me on the following activities: development of treatment plan with patient and/or surrogate as well as nursing, discussions with consultants, evaluation of patient's response to treatment, examination of patient, obtaining history from patient or surrogate, ordering and performing treatments and interventions, ordering and review of laboratory studies, ordering and review of radiographic studies, pulse oximetry and re-evaluation of patient's condition.    Benjamin Huang, Marily Memos, MD 11/30/21 [phone]

## 2021-11-30 NOTE — ED Notes (Signed)
Pt has been placed on the Zoll, pads in correct placement. This RN has signed consent as witness, pt has signed consent. Anesthesiologist and MD performing procedure needs to complete consent signature.   Procedure for cardiac cath, risk & benefits was explained pt via Silas Sacramento, MD

## 2021-11-30 NOTE — ED Notes (Addendum)
Secure message from Elmer Sow, RN at [facility] to advise "lab just called troponin is 2406"

## 2021-11-30 NOTE — Progress Notes (Signed)
  Echocardiogram 2D Echocardiogram has been performed.  Benjamin Huang 11/30/2021, 2:25 PM

## 2021-11-30 NOTE — ED Notes (Signed)
Pt has arrived to [facility] bed 12 hall via [facility], NAD noted, NSR on the monitor, VSS 133/77 HR 68 98% on 2L  16 RR per EMS. Nitro currently at 120 mcg/min. 5/10 CP  "annoying pain" per. A&O x4.

## 2021-11-30 NOTE — Assessment & Plan Note (Signed)
Patient with epigastric pain and belching that was persistent leading to presentation to [facility]. Initial EKG with ST depression 55mm in III c/w inferior injury.  Plan See ACS above

## 2021-11-30 NOTE — H&P (Signed)
History and Physical    Benjamin Huang JOA:[id number] DOB: Jul 09, 1970 DOA: 11/29/2021  DOS: the patient was seen and examined on 11/29/2021  PCP: Erasmo Downer, NP   Patient coming from: Home  I have personally briefly reviewed patient's old medical records in [facility]  [name], a 51 y/o , with h/o hepatic steatosis, HTN, tobacco use and morbid obesity with no prior cardiac history had the sudden onset of high epigastric grabbing pain with radiation to his right arm, mild SOB and diaphoresis. The pain was persistent leading him to present [facility] for evaluation.    ED Course: Afebrile, 153/97 HR 72  RR 26. Patient was hypertensive and having persistent pain abd IV NTG started. EKG with ST depression in inferior leads. Initial Troponin 95. CMET OK, CBC nl, U/A negative. CXR NAD. Patient was started on full dose IV heparin. Cardiology consulted who agreed with heparin and recommended TRH admit to [facility]. Subsequently troponin returned at 665. Repeat EKG x 2 revealed resolution of ST depression. However, patient continue to have mild discomfort. EDP was arranging for ED to ED transfer.   Review of Systems:  Review of Systems  Constitutional: Negative.   HENT: Negative.    Eyes: Negative.   Respiratory: Negative.    Cardiovascular:  Positive for chest pain.       Diaphoresis  Gastrointestinal: Negative.   Genitourinary: Negative.   Musculoskeletal: Negative.   Skin: Negative.   Neurological: Negative.   Endo/Heme/Allergies: Negative.   Psychiatric/Behavioral: Negative.      Past Medical History:  Diagnosis Date   HTN (hypertension)    Obesity, morbid, BMI 40.0-49.9 (HCC)    Soc Hx - single parent of 45 y/o son. Works Designer, fashion/clothing for [employer]  History reviewed. No pertinent surgical history.   reports that he has been smoking cigars. He has never used smokeless tobacco. He reports current alcohol use. He reports that he does not use drugs.  No Known  Allergies  Family History  Problem Relation Age of Onset   Colon cancer Neg Hx    Liver disease Neg Hx     Prior to Admission medications   Medication Sig Start Date End Date Taking? Authorizing Provider  amLODipine (NORVASC) 10 MG tablet Take 10 mg by mouth daily.    [provider]  hydrochlorothiazide (HYDRODIURIL) 25 MG tablet Take 25 mg by mouth daily.    [provider]  losartan (COZAAR) 50 MG tablet Take 1 tablet by mouth daily.    [provider]  Omega-3 Fatty Acids (FISH OIL PO) Take by mouth as needed.    [provider]    Physical Exam: Vitals:   11/30/21 0200 11/30/21 0218 11/30/21 0220 11/30/21 0240  BP: (!) 151/90 130/71 136/88 130/73  Pulse: 74 81 71 80  Resp: 12 19 (!) 26 15  Temp:      TempSrc:      SpO2: 99% 97% 97% 98%  Weight:      Height:        Physical Exam Vitals and nursing note reviewed.  Constitutional:      General: He is not in acute distress.    Appearance: He is well-developed. He is obese. He is not ill-appearing.  HENT:     Head: Normocephalic and atraumatic.     Mouth/Throat:     Mouth: Mucous membranes are moist.  Eyes:     Extraocular Movements: Extraocular movements intact.     Pupils:  Pupils are equal, round, and reactive to light.  Cardiovascular:     Rate and Rhythm: Normal rate and regular rhythm.     Heart sounds: Normal heart sounds.  Pulmonary:     Effort: Pulmonary effort is normal. No respiratory distress.     Breath sounds: Normal breath sounds. No rales.  Abdominal:     General: Abdomen is protuberant. Bowel sounds are normal.     Palpations: Abdomen is soft. There is no hepatomegaly.  Skin:    General: Skin is warm and dry.  Neurological:     General: No focal deficit present.     Mental Status: He is alert and oriented to person, place, and time.  Psychiatric:        Mood and Affect: Mood normal.        Behavior: Behavior normal.      Labs on Admission: I have  personally reviewed following labs and imaging studies  CBC: Recent Labs  Lab 11/29/21 2209  WBC 11.4*  NEUTROABS 8.5*  HGB 15.8  HCT 45.0  MCV 84.1  PLT 264   Basic Metabolic Panel: Recent Labs  Lab 11/29/21 2209 11/30/21 0016  NA 139  --   K 3.3*  --   CL 106  --   CO2 23  --   GLUCOSE 139*  --   BUN 13  --   CREATININE 0.89  --   CALCIUM 9.7  --   MG  --  2.0   GFR: Estimated Creatinine Clearance: 139.3 mL/min (by C-G formula based on SCr of 0.89 mg/dL). Liver Function Tests: Recent Labs  Lab 11/29/21 2209  AST 31  ALT 38  ALKPHOS 100  BILITOT 0.7  PROT 8.3*  ALBUMIN 4.5   No results for input(s): "LIPASE", "AMYLASE" in the last 168 hours. No results for input(s): "AMMONIA" in the last 168 hours. Coagulation Profile: No results for input(s): "INR", "PROTIME" in the last 168 hours. Cardiac Enzymes: No results for input(s): "CKTOTAL", "CKMB", "CKMBINDEX", "TROPONINI" in the last 168 hours. BNP (last 3 results) No results for input(s): "PROBNP" in the last 8760 hours. HbA1C: No results for input(s): "HGBA1C" in the last 72 hours. CBG: No results for input(s): "GLUCAP" in the last 168 hours. Lipid Profile: No results for input(s): "CHOL", "HDL", "LDLCALC", "TRIG", "CHOLHDL", "LDLDIRECT" in the last 72 hours. Thyroid Function Tests: No results for input(s): "TSH", "T4TOTAL", "FREET4", "T3FREE", "THYROIDAB" in the last 72 hours. Anemia Panel: No results for input(s): "VITAMINB12", "FOLATE", "FERRITIN", "TIBC", "IRON", "RETICCTPCT" in the last 72 hours. Urine analysis:    Component Value Date/Time   COLORURINE YELLOW 11/29/2021 2154   APPEARANCEUR CLOUDY (A) 11/29/2021 2154   LABSPEC 1.015 11/29/2021 2154   PHURINE 6.0 11/29/2021 2154   GLUCOSEU NEGATIVE 11/29/2021 2154   HGBUR NEGATIVE 11/29/2021 2154   BILIRUBINUR NEGATIVE 11/29/2021 2154   KETONESUR 20 (A) 11/29/2021 2154   PROTEINUR NEGATIVE 11/29/2021 2154   UROBILINOGEN 0.2 01/24/2015 2245    NITRITE NEGATIVE 11/29/2021 2154   LEUKOCYTESUR NEGATIVE 11/29/2021 2154    Radiological Exams on Admission: I have personally reviewed images DG Chest Port 1 View  Result Date: 11/29/2021 CLINICAL DATA:  Chest pain EXAM: PORTABLE CHEST 1 VIEW COMPARISON:  None Available. FINDINGS: The heart size and mediastinal contours are within normal limits. Both lungs are clear. The visualized skeletal structures are unremarkable. IMPRESSION: No active disease. Electronically Signed   By: Helyn Numbers M.D.   On: 11/29/2021 22:03    EKG: I have  personally reviewed EKG: #1 15mm ST depression II, I-RBBB; #2 no ST depression, #3 no ST depression  Assessment/Plan Principal Problem:   ACS (acute coronary syndrome) (HCC) Active Problems:   Atypical chest pain   HTN (hypertension)    Assessment and Plan: * ACS (acute coronary syndrome) [facility]) Patient presented with atypical chest pain. Troponin #1 95, #2  665. Initial EKG with inferior injury, subsequent EKGs without ST depression. Risk factors include age, obesity and HTN, prediabetic.   Plan Heparin infusion  ASA  Beta blocker  Cardiology consulted - admit to [facility]  Atypical chest pain Patient with epigastric pain and belching that was persistent leading to presentation to [facility]. Initial EKG with ST depression 50mm in III c/w inferior injury.  Plan See ACS above  HTN (hypertension) BP mildly elevated in acute setting.  Plan Continue home medications       DVT prophylaxis: IV heparin gtts Code Status: Full Code Family Communication: spke with Percival Spanish, cousin and contact  Disposition Plan: TBD  Consults called: cardiology - Dr Marciano Sequin  Admission status: Inpatient, Step Down Unit   Illene Regulus, MD [facility] 11/30/2021, 2:57 AM

## 2021-11-30 NOTE — Telephone Encounter (Signed)
Patient Advocate Encounter   Received notification that prior authorization for icosapent ethyl (Vascepa) 1 g is required.   PA Faxed on 11/30/2021 To [employer] Status is pending       Roland Earl, CPhT Pharmacy Patient Advocate Specialist [facility] Pharm[facility] Patient Advocate Team Direct Number: [phone]  Fax: [phone]

## 2021-11-30 NOTE — Interval H&P Note (Signed)
Cath Lab Visit (complete for each Cath Lab visit)  Clinical Evaluation Leading to the Procedure:   ACS: Yes.    Non-ACS:    Anginal Classification: CCS II  Anti-ischemic medical therapy: No Therapy  Non-Invasive Test Results: No non-invasive testing performed  Prior CABG: No previous CABG      History and Physical Interval Note:  11/30/2021 6:35 AM  Benjamin Huang  has presented today for surgery, with the diagnosis of STEMI.  The various methods of treatment have been discussed with the patient and family. After consideration of risks, benefits and other options for treatment, the patient has consented to  Procedure(s): Coronary/Graft Acute MI Revascularization (N/A) LEFT HEART CATH AND CORONARY ANGIOGRAPHY (N/A) as a surgical intervention.  The patient's history has been reviewed, patient examined, no change in status, stable for surgery.  I have reviewed the patient's chart and labs.  Questions were answered to the patient's satisfaction.     Nanetta Batty

## 2021-11-30 NOTE — Care Management (Signed)
  Transition of Care [facility]) Screening Note   Patient Details  Name: Benjamin Huang Date of Birth: 1970/06/12   Transition of Care [facility]) CM/SW Contact:    Gala Lewandowsky, RN Phone Number: 11/30/2021, 12:51 PM    Transition of Care Department [facility]) has reviewed the patient and no TOC needs have been identified at this time. TOC will continue to monitor patient advancement through interdisciplinary progression rounds. If new patient transition needs arise, please place a TOC consult.

## 2021-11-30 NOTE — ED Notes (Addendum)
Date and time results received: 11/30/21 0302  Test: Troponin Critical Value: 2406   Name of Provider Notified: Dr. Madilyn Hook   Orders Received? Or Actions Taken?: Cardiology to be paged by Dr. Madilyn Hook  Dr. Madilyn Hook aware of current Troponin, has reached out to cardiology team

## 2021-11-30 NOTE — Subjective & Objective (Signed)
Mr. Siebert, a 51 y/o , with h/o hepatic steatosis, HTN, tobacco use and morbid obesity with no prior cardiac history had the sudden onset of high epigastric grabbing pain with radiation to his right arm, mild SOB and diaphoresis. The pain was persistent leading him to present [facility] for evaluation.

## 2021-11-30 NOTE — Assessment & Plan Note (Signed)
BP mildly elevated in acute setting.  Plan Continue home medications

## 2021-11-30 NOTE — ED Notes (Addendum)
Lab called with critical.  This RN notified nurse at [facility]  Test: Troponin Critical Value: 2406

## 2021-11-30 NOTE — TOC Benefit Eligibility Note (Signed)
Patient Product/process development scientist completed.    The patient is currently admitted and upon discharge could be taking Brilinta 90 mg.  Requires Prior Authorization  The patient is insured through [employer]    Roland Earl, CPhT Pharmacy Patient Advocate Specialist [facility] Patient Advocate Team Direct Number: [phone]  Fax: [phone]

## 2021-11-30 NOTE — ED Notes (Signed)
Pt given fentanyl and now is resting more comfortably. Nitro is still being titrated for comfort.

## 2021-11-30 NOTE — Telephone Encounter (Signed)
Patient Advocate Encounter   Received notification that prior authorization for Brilinta 90 mg tablets is required.   PA Faxed on 11/30/2021 To [facility] Status is pending       Roland Earl, CPhT Pharmacy Patient Advocate Specialist [facility] Patient Advocate Team Direct Number: [phone]  Fax: [phone]

## 2021-11-30 NOTE — ED Notes (Signed)
Onalee Hua, RN rapid response team at the bedside

## 2021-11-30 NOTE — ED Notes (Signed)
Pt to cath lab at this time with this RN and rapid response Phyllis Ginger, along with Dr. Hector Brunswick. Pt A&O x4, zoll and cardiac monitor remain on pt during transport

## 2021-11-30 NOTE — ED Notes (Addendum)
Pt chest and abdominal pain is starting to return again. Nitro being titrated by this RN for chest pain relief while monitoring pt blood pressure. EDP is aware of pt increase in chest pain.

## 2021-11-30 NOTE — Progress Notes (Signed)
Patient under Cardiology service, discussed with Dr Allyson Sabal. Please reach out to [facility] if necessary.   Stephania Fragmin MD [facility]

## 2021-11-30 NOTE — Consult Note (Signed)
Cardiology Consultation:   Patient ID: [name] MRN: [id number]; DOB: [birth year]  Admit date: 11/29/2021 Date of Consult: 11/30/2021  PCP:  Erasmo Downer, NP   [facility] Providers Cardiologist:  None        Patient Profile:   Benjamin Huang is a 51 y.o. male with a hx of hypertension, hyperlipidemia, tobacco abuse, morbid obesity and steatosis who is being seen 11/30/2021 for the evaluation of chest pain with elevated troponin at the request of hospitalist.  History of Present Illness:   Mr. Benjamin Huang is a very pleasant gentleman started having chest pain at rest around 3 PM 11/29/2021.  Patient has multiple risk factors including morbid obesity, hypertension, history of coronary stents.Patient denies any shortness of breath, palpitations, presyncope or syncope. While in ED, still co CP. Trop uptrending, index EKG w ST-T depression noted, subsequent ones - improved.Started on nitro drip, uptitrated, fentanyl, bb. Still w pain. Decision was made for urgent LCP.    Past Medical History:  Diagnosis Date   HTN (hypertension)    Obesity, morbid, BMI 40.0-49.9 (HCC)     History reviewed. No pertinent surgical history.   Home Medications:  Prior to Admission medications   Medication Sig Start Date End Date Taking? Authorizing Provider  amLODipine (NORVASC) 10 MG tablet Take 10 mg by mouth daily.    [provider]  hydrochlorothiazide (HYDRODIURIL) 25 MG tablet Take 25 mg by mouth daily.    [provider]  losartan (COZAAR) 50 MG tablet Take 1 tablet by mouth daily.    [provider]  Omega-3 Fatty Acids (FISH OIL PO) Take by mouth as needed.    [provider]    Inpatient Medications: Scheduled Meds:  [MAR Hold] amLODipine  10 mg Oral Daily   [MAR Hold] aspirin  324 mg Oral Once   [MAR Hold] aspirin  324 mg Oral NOW   Or   [MAR Hold] aspirin  300 mg Rectal NOW   [MAR Hold] aspirin EC  81 mg Oral Daily   [MAR Hold] fentaNYL  (SUBLIMAZE) injection  50 mcg Intravenous Once   [MAR Hold] hydrochlorothiazide  25 mg Oral Daily   [MAR Hold] losartan  50 mg Oral Daily   [MAR Hold] metoprolol tartrate  25 mg Oral BID   [MAR Hold] omega-3 acid ethyl esters  1 g Oral Daily   [MAR Hold] rosuvastatin  20 mg Oral Daily   Continuous Infusions:  sodium chloride Stopped (11/30/21 0248)   sodium chloride 50 mL/hr at 11/30/21 0425   heparin 1,600 Units/hr (11/30/21 0426)   nitroGLYCERIN 120 mcg/min (11/30/21 0427)   PRN Meds: [UTM Hold] acetaminophen, [MAR Hold] fentaNYL (SUBLIMAZE) injection, [MAR Hold] nitroGLYCERIN, [MAR Hold] ondansetron (ZOFRAN) IV  Allergies:   No Known Allergies  Social History:   Social History   Socioeconomic History   Marital status: Single    Spouse name: Not on file   Number of children: Not on file   Years of education: Not on file   Highest education level: Not on file  Occupational History   Not on file  Tobacco Use   Smoking status: Some Days    Types: Cigars   Smokeless tobacco: Never   Tobacco comments:    cigar every now and then  Substance and Sexual Activity   Alcohol use: Yes    Comment: occas   Drug use: No   Sexual activity: Not on file  Other Topics Concern   Not on file  Social History Narrative   Not on file   Social Determinants of Health   Financial Resource Strain: Not on file  Food Insecurity: Not on file  Transportation Needs: Not on file  Physical Activity: Not on file  Stress: Not on file  Social Connections: Not on file  Intimate Partner Violence: Not on file    Family History:    Family History  Problem Relation Age of Onset   Colon cancer Neg Hx    Liver disease Neg Hx      ROS:  Please see the history of present illness.   All other ROS reviewed and negative.     Physical Exam/Data:   Vitals:   11/30/21 0330 11/30/21 0428 11/30/21 0500 11/30/21 0530  BP: (!) 140/77 124/82 122/60 120/70  Pulse: 69 70 70 67  Resp: 18 14 18 19    Temp:  98.6 F (37 C)    TempSrc:  Oral    SpO2: 93% 100% 97% 99%  Weight:      Height:        Intake/Output Summary (Last 24 hours) at 11/30/2021 0644 Last data filed at 11/30/2021 0333 Gross per 24 hour  Intake 75.04 ml  Output --  Net 75.04 ml      11/29/2021    9:15 PM 02/03/2019    9:25 AM 01/24/2015    9:35 PM  Last 3 Weights  Weight (lbs) 290 lb 310 lb 290 lb  Weight (kg) 131.543 kg 140.615 kg 131.543 kg     Body mass index is 39.33 kg/m.  General:  Well nourished, well developed, in mild distress HEENT: normal Neck: no JVD Vascular: No carotid bruits; Distal pulses 2+ bilaterally Cardiac:  normal S1, S2; RRR; no murmur  Lungs:  clear to auscultation bilaterally, no wheezing, rhonchi or rales  Abd: soft, nontender, no hepatomegaly  Ext: no edema Musculoskeletal:  No deformities, BUE and BLE strength normal and equal Skin: warm and dry  Neuro:  CNs 2-12 intact, no focal abnormalities noted Psych:  Normal affect   EKG:  The EKG was personally reviewed and demonstrates: NSR w ST-T changes  Telemetry:  Telemetry was personally reviewed and demonstrates:    Relevant CV Studies: none  Laboratory Data:  High Sensitivity Troponin:   Recent Labs  Lab 11/29/21 2209 11/30/21 0016 11/30/21 0302  TROPONINIHS 95* 665* 2,406*     Chemistry Recent Labs  Lab 11/29/21 2209 11/30/21 0016  NA 139  --   K 3.3*  --   CL 106  --   CO2 23  --   GLUCOSE 139*  --   BUN 13  --   CREATININE 0.89  --   CALCIUM 9.7  --   MG  --  2.0  GFRNONAA >60  --   ANIONGAP 10  --     Recent Labs  Lab 11/29/21 2209  PROT 8.3*  ALBUMIN 4.5  AST 31  ALT 38  ALKPHOS 100  BILITOT 0.7   Lipids  Recent Labs  Lab 11/30/21 0302  CHOL 184  TRIG 74  HDL 36*  LDLCALC 133*  CHOLHDL 5.1    Hematology Recent Labs  Lab 11/29/21 2209  WBC 11.4*  RBC 5.35  HGB 15.8  HCT 45.0  MCV 84.1  MCH 29.5  MCHC 35.1  RDW 14.1  PLT 264   Thyroid No results for input(s): "TSH",  "FREET4" in the last 168 hours.  BNPNo results for input(s): "BNP", "PROBNP" in the last 168 hours.  DDimer No results for input(s): "DDIMER" in the last 168 hours.   Radiology/Studies:  DG Chest Port 1 View  Result Date: 11/29/2021 CLINICAL DATA:  Chest pain EXAM: PORTABLE CHEST 1 VIEW COMPARISON:  None Available. FINDINGS: The heart size and mediastinal contours are within normal limits. Both lungs are clear. The visualized skeletal structures are unremarkable. IMPRESSION: No active disease. Electronically Signed   By: Helyn Numbers M.D.   On: 11/29/2021 22:03     Assessment and Plan:   High risk NSTEMI. Still with pain despite high-dose of ngt fft, fentanyl and bb. On heparin drip Plan LCP now Cw heparin drip DAPT? Fu lipid profile, a1c Cw bb, statin FU TTE  2.Smoking Aggressive smoking cessation conselling   Risk Assessment/Risk Scores:     TIMI Risk Score for Unstable Angina or Non-ST Elevation MI:   The patient's TIMI risk score is 4, which indicates a 20% risk of all cause mortality, new or recurrent myocardial infarction or need for urgent revascularization in the next 14 days.          For questions or updates, please contact [facility] Please consult [email or web address] for contact info under    Signed, Lenon Oms, MD  11/30/2021 6:44 AM

## 2021-11-30 NOTE — Assessment & Plan Note (Signed)
Patient presented with atypical chest pain. Troponin #1 95, #2  665. Initial EKG with inferior injury, subsequent EKGs without ST depression. Risk factors include age, obesity and HTN, prediabetic.   Plan Heparin infusion  ASA  Beta blocker  Cardiology consulted - admit to [facility]

## 2021-11-30 NOTE — ED Notes (Addendum)
Date and time results received: 11/30/21 0110   Test: Troponin Critical Value: 665  Name of Provider Notified: [place]  Orders Received? Or Actions Taken?: NA

## 2021-11-30 NOTE — Telephone Encounter (Addendum)
Pharmacy Patient Advocate Encounter  Insurance verification completed.    The patient has coverage through [employer]   The patient is currently admitted and ran test claims for the following: Brilinta.  Copays and coinsurance results were relayed to Inpatient clinical team.

## 2021-11-30 NOTE — ED Notes (Signed)
Labs were collected at [facility]) PTA, refer to EMR for results

## 2021-12-01 ENCOUNTER — Other Ambulatory Visit (HOSPITAL_COMMUNITY): Payer: Self-pay

## 2021-12-01 ENCOUNTER — Encounter: Payer: Self-pay | Admitting: *Deleted

## 2021-12-01 DIAGNOSIS — Z006 Encounter for examination for normal comparison and control in clinical research program: Secondary | ICD-10-CM

## 2021-12-01 DIAGNOSIS — I10 Essential (primary) hypertension: Secondary | ICD-10-CM

## 2021-12-01 LAB — BASIC METABOLIC PANEL
Anion gap: 7 (ref 5–15)
BUN: 7 mg/dL (ref 6–20)
CO2: 25 mmol/L (ref 22–32)
Calcium: 8.6 mg/dL — ABNORMAL LOW (ref 8.9–10.3)
Chloride: 104 mmol/L (ref 98–111)
Creatinine, Ser: 0.93 mg/dL (ref 0.61–1.24)
GFR, Estimated: >60 mL/min (ref >60)
Glucose, Bld: 99 mg/dL (ref 70–99)
Potassium: 3.2 mmol/L — ABNORMAL LOW (ref 3.5–5.1)
Sodium: 136 mmol/L (ref 135–145)

## 2021-12-01 LAB — CBC
HCT: 41.8 % (ref 39.0–52.0)
Hemoglobin: 14.8 g/dL (ref 13.0–17.0)
MCH: 29.5 pg (ref 26.0–34.0)
MCHC: 35.4 g/dL (ref 30.0–36.0)
MCV: 83.3 fL (ref 80.0–100.0)
Platelets: 220 10*3/uL (ref 150–400)
RBC: 5.02 MIL/uL (ref 4.22–5.81)
RDW: 14.1 % (ref 11.5–15.5)
WBC: 9.3 10*3/uL (ref 4.0–10.5)
nRBC: 0 % (ref 0.0–0.2)

## 2021-12-01 LAB — LIPOPROTEIN A (LPA): Lipoprotein (a): 120.9 nmol/L — ABNORMAL HIGH (ref <75.0)

## 2021-12-01 LAB — TROPONIN I (HIGH SENSITIVITY): Troponin I (High Sensitivity): >24000 ng/L (ref <18)

## 2021-12-01 MED ORDER — TICAGRELOR 90 MG PO TABS
90.0000 mg | ORAL_TABLET | Freq: Two times a day (BID) | ORAL | 11 refills | Status: DC
  Filled 2021-12-01: qty 60, 30d supply, fill #0

## 2021-12-01 MED ORDER — ASPIRIN 81 MG PO CHEW
81.0000 mg | CHEWABLE_TABLET | Freq: Every day | ORAL | 11 refills | Status: AC
  Filled 2021-12-01: qty 30, 30d supply, fill #0

## 2021-12-01 MED ORDER — POTASSIUM CHLORIDE CRYS ER 20 MEQ PO TBCR
40.0000 meq | EXTENDED_RELEASE_TABLET | Freq: Once | ORAL | Status: AC
Start: 2021-12-01 — End: 2021-12-01
  Administered 2021-12-01: 40 meq via ORAL
  Filled 2021-12-01: qty 2

## 2021-12-01 MED ORDER — LOSARTAN POTASSIUM 50 MG PO TABS
100.0000 mg | ORAL_TABLET | Freq: Every day | ORAL | Status: DC
Start: 2021-12-02 — End: 2021-12-02
  Administered 2021-12-02: 100 mg via ORAL
  Filled 2021-12-01: qty 2

## 2021-12-01 MED ORDER — ORAL CARE MOUTH RINSE: 15.0000 mL | OROMUCOSAL | Status: DC | PRN

## 2021-12-01 MED ORDER — LOSARTAN POTASSIUM 50 MG PO TABS
50.0000 mg | ORAL_TABLET | Freq: Once | ORAL | Status: AC
  Administered 2021-12-01: 50 mg via ORAL
  Filled 2021-12-01: qty 1

## 2021-12-01 NOTE — Progress Notes (Signed)
CARDIAC REHAB PHASE I   PRE:  Rate/Rhythm: 83 SR  BP:  Sitting: 148/106      SaO2: 96 RA  MODE:  Ambulation: 370 ft   POST:  Rate/Rhythm: 86 SR  BP:  Sitting: 149/110      SaO2: 98 RA  Ambulated with pt in hall. He is independent with ambulation. Tolerated well with no CP or SOB. Back to room in chair with call bell and bedside table in reach. Home teaching including site care, risk factors, heart healthy diet, MI booklet, Antiplatelet  therapy importance, smoking cessation, exercise guidelines, restrictions, and CRP2. Will refer to [facility] for CRP2. All questions and concerns addressed. Will continue to follow.   [id number]  Woodroe Chen, RN BSN 12/01/2021 10:36 AM

## 2021-12-01 NOTE — Progress Notes (Signed)
Progress Note  Patient Name: Benjamin Huang Date of Encounter: 12/01/2021  [facility] Cardiologist: Dr. Quay Burow  Subjective   Postop day 1 anterolateral STEMI treated with PCI drug-eluting stenting.  He is pain-free this morning.  His blood pressure has been trending higher.  Inpatient Medications    Scheduled Meds:  amLODipine  10 mg Oral Daily   aspirin  324 mg Oral Once   aspirin  81 mg Oral Daily   Chlorhexidine Gluconate Cloth  6 each Topical Daily   enoxaparin (LOVENOX) injection  40 mg Subcutaneous Q24H   fentaNYL (SUBLIMAZE) injection  50 mcg Intravenous Once   hydrochlorothiazide  25 mg Oral Daily   losartan  50 mg Oral Daily   metoprolol tartrate  25 mg Oral BID   omega-3 acid ethyl esters  1 g Oral Daily   rosuvastatin  40 mg Oral Daily   sodium chloride flush  3 mL Intravenous Q12H   ticagrelor  90 mg Oral BID   Continuous Infusions:  sodium chloride     nitroGLYCERIN Stopped (11/30/21 0625)   PRN Meds: sodium chloride, acetaminophen, fentaNYL (SUBLIMAZE) injection, morphine injection, nitroGLYCERIN, ondansetron (ZOFRAN) IV, mouth rinse, sodium chloride flush   Vital Signs    Vitals:   12/01/21 0500 12/01/21 0600 12/01/21 0700 12/01/21 0800  BP: (!) 146/100 (!) 164/114 (!) 163/116 (!) 145/84  Pulse: 75 75 89 83  Resp:  19 20   Temp:      TempSrc:      SpO2: 95% 94% 94% 96%  Weight:  134.5 kg    Height:        Intake/Output Summary (Last 24 hours) at 12/01/2021 0835 Last data filed at 12/01/2021 0830 Gross per 24 hour  Intake 1541.53 ml  Output 1625 ml  Net -83.47 ml      12/01/2021    6:00 AM 11/29/2021    9:15 PM 02/03/2019    9:25 AM  Last 3 Weights  Weight (lbs) 296 lb 8.3 oz 290 lb 310 lb  Weight (kg) 134.5 kg 131.543 kg 140.615 kg      Telemetry    Sinus rhythm- Personally Reviewed  ECG    Normal sinus rhythm at 83 with indeterminate axis- Personally Reviewed  Physical Exam   GEN: No acute distress.   Neck: No  JVD Cardiac: RRR, no murmurs, rubs, or gallops.  Respiratory: Clear to auscultation bilaterally. GI: Soft, nontender, non-distended  MS: No edema; No deformity. Neuro:  Nonfocal  Psych: Normal affect   Labs    High Sensitivity Troponin:   Recent Labs  Lab 11/29/21 2209 11/30/21 0016 11/30/21 0302 12/01/21 0627  TROPONINIHS 95* 665* 2,406* >24,000*     Chemistry Recent Labs  Lab 11/29/21 2209 11/30/21 0016 12/01/21 0627  NA 139  --  136  K 3.3*  --  3.2*  CL 106  --  104  CO2 23  --  25  GLUCOSE 139*  --  99  BUN 13  --  7  CREATININE 0.89  --  0.93  CALCIUM 9.7  --  8.6*  MG  --  2.0  --   PROT 8.3*  --   --   ALBUMIN 4.5  --   --   AST 31  --   --   ALT 38  --   --   ALKPHOS 100  --   --   BILITOT 0.7  --   --   GFRNONAA >60  --  >  60  ANIONGAP 10  --  7    Lipids  Recent Labs  Lab 11/30/21 0302  CHOL 184  TRIG 74  HDL 36*  LDLCALC 133*  CHOLHDL 5.1    Hematology Recent Labs  Lab 11/29/21 2209 12/01/21 0627  WBC 11.4* 9.3  RBC 5.35 5.02  HGB 15.8 14.8  HCT 45.0 41.8  MCV 84.1 83.3  MCH 29.5 29.5  MCHC 35.1 35.4  RDW 14.1 14.1  PLT 264 220   Thyroid No results for input(s): "TSH", "FREET4" in the last 168 hours.  BNPNo results for input(s): "BNP", "PROBNP" in the last 168 hours.  DDimer No results for input(s): "DDIMER" in the last 168 hours.   Radiology    ECHOCARDIOGRAM COMPLETE  Result Date: 11/30/2021    ECHOCARDIOGRAM REPORT   Patient Name:   Benjamin Huang Date of Exam: 11/30/2021 Medical Rec #:  [id number]       Height:       72.0 in Accession #:    [id number]      Weight:       290.0 lb Date of Birth:  03/22/1971      BSA:          2.495 m Patient Age:    42 years        BP:           150/88 mmHg Patient Gender: M               HR:           70 bpm. Exam Location:  Inpatient Procedure: 2D Echo, Cardiac Doppler and Color Doppler Indications:    Chest pain  History:        Patient has no prior history of Echocardiogram examinations.                  Acute MI; Risk Factors:Hypertension. ACS.  Sonographer:    Clayton Lefort RDCS (AE) Referring Phys: Neena Rhymes  Sonographer Comments: Suboptimal subcostal window. IMPRESSIONS  1. Left ventricular ejection fraction, by estimation, is 60 to 65%. The left ventricle has normal function. The left ventricle has no regional wall motion abnormalities. There is moderate left ventricular hypertrophy. Left ventricular diastolic parameters were low normal for age.  2. Right ventricular systolic function is normal. The right ventricular size is normal. Tricuspid regurgitation signal is inadequate for assessing PA pressure.  3. The mitral valve is normal in structure. Trivial mitral valve regurgitation. No evidence of mitral stenosis.  4. The aortic valve is tricuspid. Aortic valve regurgitation is trivial. No aortic stenosis is present.  5. Aortic dilatation noted. There is borderline dilatation of the ascending aorta, measuring 39 mm.  6. The inferior vena cava is dilated in size with <50% respiratory variability, suggesting right atrial pressure of 15 mmHg. FINDINGS  Left Ventricle: Left ventricular ejection fraction, by estimation, is 60 to 65%. The left ventricle has normal function. The left ventricle has no regional wall motion abnormalities. The left ventricular internal cavity size was normal in size. There is  moderate left ventricular hypertrophy. Left ventricular diastolic parameters were normal. Right Ventricle: The right ventricular size is normal. No increase in right ventricular wall thickness. Right ventricular systolic function is normal. Tricuspid regurgitation signal is inadequate for assessing PA pressure. Left Atrium: Left atrial size was normal in size. Right Atrium: Right atrial size was normal in size. Pericardium: There is no evidence of pericardial effusion. Mitral Valve: The mitral valve is normal in  structure. Trivial mitral valve regurgitation. No evidence of mitral valve stenosis.  Tricuspid Valve: The tricuspid valve is normal in structure. Tricuspid valve regurgitation is trivial. No evidence of tricuspid stenosis. Aortic Valve: The aortic valve is tricuspid. Aortic valve regurgitation is trivial. No aortic stenosis is present. Aortic valve mean gradient measures 3.0 mmHg. Aortic valve peak gradient measures 5.5 mmHg. Aortic valve area, by VTI measures 4.16 cm. Pulmonic Valve: The pulmonic valve was normal in structure. Pulmonic valve regurgitation is trivial. No evidence of pulmonic stenosis. Aorta: The aortic root is normal in size and structure and aortic dilatation noted. There is borderline dilatation of the ascending aorta, measuring 39 mm. Venous: The inferior vena cava is dilated in size with less than 50% respiratory variability, suggesting right atrial pressure of 15 mmHg. IAS/Shunts: No atrial level shunt detected by color flow Doppler.  LEFT VENTRICLE PLAX 2D LVIDd:         5.20 cm   Diastology LVIDs:         3.10 cm   LV e' medial:    7.18 cm/s LV PW:         1.40 cm   LV E/e' medial:  8.8 LV IVS:        1.50 cm   LV e' lateral:   8.70 cm/s LVOT diam:     2.40 cm   LV E/e' lateral: 7.2 LV SV:         92 LV SV Index:   37 LVOT Area:     4.52 cm  RIGHT VENTRICLE             IVC RV Basal diam:  3.00 cm     IVC diam: 2.30 cm RV S prime:     15.40 cm/s TAPSE (M-mode): 2.9 cm LEFT ATRIUM             Index        RIGHT ATRIUM           Index LA diam:        3.90 cm 1.56 cm/m   RA Area:     17.00 cm LA Vol (A2C):   82.8 ml 33.19 ml/m  RA Volume:   44.10 ml  17.68 ml/m LA Vol (A4C):   73.6 ml 29.50 ml/m LA Biplane Vol: 78.8 ml 31.58 ml/m  AORTIC VALVE AV Area (Vmax):    4.25 cm AV Area (Vmean):   4.11 cm AV Area (VTI):     4.16 cm AV Vmax:           117.00 cm/s AV Vmean:          82.100 cm/s AV VTI:            0.222 m AV Peak Grad:      5.5 mmHg AV Mean Grad:      3.0 mmHg LVOT Vmax:         110.00 cm/s LVOT Vmean:        74.600 cm/s LVOT VTI:          0.204 m LVOT/AV VTI  ratio: 0.92  AORTA Ao Root diam: 3.40 cm Ao Asc diam:  3.90 cm MITRAL VALVE MV Area (PHT): 4.71 cm    SHUNTS MV Decel Time: 161 msec    Systemic VTI:  0.20 m MV E velocity: 62.90 cm/s  Systemic Diam: 2.40 cm MV A velocity: 59.20 cm/s MV E/A ratio:  1.06 Cherlynn Kaiser MD Electronically signed by Cherlynn Kaiser MD Signature Date/Time: 11/30/2021/10:09:10 PM  Final    CARDIAC CATHETERIZATION  Result Date: 11/30/2021 Images from the original result were not included.   1st Diag lesion is 100% stenosed.   A drug-eluting stent was successfully placed using a SYNERGY XD 3.0X16.   Post intervention, there is a 0% residual stenosis.   There is mild to moderate left ventricular systolic dysfunction.   LV end diastolic pressure is mildly elevated.   The left ventricular ejection fraction is 45-50% by visual estimate. Benjamin Huang is a 51 y.o. male  [id number] LOCATION:  FACILITY: [facility] PHYSICIAN: Quay Burow, M.D. Sep 25, 1970 DATE OF PROCEDURE:  11/30/2021 DATE OF DISCHARGE: CARDIAC CATHETERIZATION / PCI DES Diag History obtained from chart review.  51 year old single African-American male with no children who lives in [place].  He presented around 3 AM with chest pain, was seen by a hospitalist and transferred here.  His initial EKG showed ST segment depression at around midnight.  He does have a history of hypertension and tobacco abuse.  He had ongoing chest pain despite being on high-dose IV nitroglycerin.  Because of this it was elected to take him to the Cath Lab for urgent angiography and potential intervention for non-STEMI.  It should be noted that he did have slowly rising troponins as well. PROCEDURE DESCRIPTION: The patient was brought to the second floor [facility] cath lab in the postabsorptive state. He was not premedicated . His right wrist was prepped and shaved in usual sterile fashion. Xylocaine 1% was used for local anesthesia. A 6 French sheath was inserted into the right radial  artery using standard Seldinger technique. The patient received 17,000 units  of heparin intravenously.  A 5 [place] TIG catheter and right Judkins catheters were used for selective coronary angiography and left ventriculography respectively.  Isovue dye was used for the entirety of the case (165 cc of contrast total to patient).  Retrograde aortic, ventricular and pullback pressures were recorded.  Radial cocktail was administered via the SideArm sheath. The patient received 180 mg of p.o. Brilinta.  The ACT was 281.  Isovue dye was used for the entirety of the intervention.  Retroaortic pressures monitored during the case.  Using a 6 [place] XB LAD 3.0 cm guide catheter along with 0.14 Prowater guidewire a 2 mm x 12 m balloon the diagonal branch was crossed with little difficulty and predilated establishing antegrade flow.  Following this a 3 mm x 16 mm long Synergy drug-eluting stent was then carefully positioned across the stented segment and deployed at 14 atm.  It was postdilated with a 3.25 mm x 12 mm balloon at 12 atm (3.3 mm) resulting in reduction of a total occlusion to 0% residual with TIMI-3 flow.  Patient was stable throughout the case.  He has minimal residual chest pain.  The radial sheath was removed and a Zephyr hemostatic wristband was placed on the right wrist achieving hemostasis.   Successful high first diagonal branch PCI drug-eluting stenting in the setting of non-STEMI with ongoing chest pain.  The remainder of his coronary anatomy was free of significant obstructive disease.  His EF was probably in the 40 to 45% range with high Antero lateral hypokinesia.  We talked about the importance of smoking cessation.  He will need to be on dual antiplatelet therapy uninterrupted for at least 12 months (low-dose aspirin and Brilinta) as well as beta-blocker and high-dose statin therapy.  Plan "fast track" with likely discharge on Friday or Saturday.  2D echo is pending. Quay Burow.  MD, [facility]  11/30/2021 7:36 AM    DG Chest Port 1 View  Result Date: 11/29/2021 CLINICAL DATA:  Chest pain EXAM: PORTABLE CHEST 1 VIEW COMPARISON:  None Available. FINDINGS: The heart size and mediastinal contours are within normal limits. Both lungs are clear. The visualized skeletal structures are unremarkable. IMPRESSION: No active disease. Electronically Signed   By: Fidela Salisbury M.D.   On: 11/29/2021 22:03     Cardiac Catheterization/PCI and stent (11/30/2021) COVID-19 CDC guideline data points:  The patient presents with [exposure to, suspected, or confirmed] COVID-19 with associated symptoms of [fever, dry cough, SOB, anorexia, or diarrhea], complicated by this/these comorbidities: [none].  Patient educated to notify a healthcare professional if they develop further symptoms that include chest pain, arrhythmias/palpitations, prolonged SOB or fever, or other concerning symptoms.    ASSESSMENT: [DICTATE THE MOST RELEVANT DIAGNOSIS(ES) listed below:  "COVID Actual Exposure" "COVID Acute Bronchitis" "COVID Acute Respiratory Failure" "COVID ARDS" "COVID Bronchitis NOS" "COVID Lower Respiratory Infection" "COVID Other Respiratory Infection" "COVID Pneumonia" "COVID Possible Exposure Ruled Out" "COVID Sepsis" "COVID Symptoms"] Cardiac Studies   2D echocardiogram (11/30/2021)  IMPRESSIONS     1. Left ventricular ejection fraction, by estimation, is 60 to 65%. The  left ventricle has normal function. The left ventricle has no regional  wall motion abnormalities. There is moderate left ventricular hypertrophy.  Left ventricular diastolic  parameters were low normal for age.   2. Right ventricular systolic function is normal. The right ventricular  size is normal. Tricuspid regurgitation signal is inadequate for assessing  PA pressure.   3. The mitral valve is normal in structure. Trivial mitral valve  regurgitation. No evidence of mitral stenosis.   4. The aortic valve is tricuspid.  Aortic valve regurgitation is trivial.  No aortic stenosis is present.   5. Aortic dilatation noted. There is borderline dilatation of the  ascending aorta, measuring 39 mm.   6. The inferior vena cava is dilated in size with <50% respiratory  variability, suggesting right atrial pressure of 15 mmHg.   Cardiac catheterization/PCI and stent (11/30/2021)  CARDIAC CATHETERIZATION / PCI DES Diag       History obtained from chart review.  51 year old single African-American male with no children who lives in [place].  He presented around 3 AM with chest pain, was seen by a hospitalist and transferred here.  His initial EKG showed ST segment depression at around midnight.  He does have a history of hypertension and tobacco abuse.  He had ongoing chest pain despite being on high-dose IV nitroglycerin.  Because of this it was elected to take him to the Cath Lab for urgent angiography and potential intervention for non-STEMI.  It should be noted that he did have slowly rising troponins as well.     PROCEDURE DESCRIPTION:    The patient was brought to the second floor [facility] cath lab in the postabsorptive state. He was not premedicated . His right wrist was prepped and shaved in usual sterile fashion. Xylocaine 1% was used for local anesthesia. A 6 French sheath was inserted into the right radial artery using standard Seldinger technique. The patient received 17,000 units  of heparin intravenously.  A 5 [place] TIG catheter and right Judkins catheters were used for selective coronary angiography and left ventriculography respectively.  Isovue dye was used for the entirety of the case (165 cc of contrast total to patient).  Retrograde aortic, ventricular and pullback pressures were recorded.  Radial cocktail was administered via the  SideArm sheath.  The patient received 180 mg of p.o. Brilinta.  The ACT was 281.  Isovue dye was used for the entirety of the intervention.  Retroaortic pressures  monitored during the case.  Using a 6 [place] XB LAD 3.0 cm guide catheter along with 0.14 Prowater guidewire a 2 mm x 12 m balloon the diagonal branch was crossed with little difficulty and predilated establishing antegrade flow.  Following this a 3 mm x 16 mm long Synergy drug-eluting stent was then carefully positioned across the stented segment and deployed at 14 atm.  It was postdilated with a 3.25 mm x 12 mm balloon at 12 atm (3.3 mm) resulting in reduction of a total occlusion to 0% residual with TIMI-3 flow.  Patient was stable throughout the case.  He has minimal residual chest pain.  The radial sheath was removed and a Zephyr hemostatic wristband was placed on the right wrist achieving hemostasis.       IMPRESSION: Successful high first diagonal branch PCI drug-eluting stenting in the setting of non-STEMI with ongoing chest pain.  The remainder of his coronary anatomy was free of significant obstructive disease.  His EF was probably in the 40 to 45% range with high Antero lateral hypokinesia.  We talked about the importance of smoking cessation.  He will need to be on dual antiplatelet therapy uninterrupted for at least 12 months (low-dose aspirin and Brilinta) as well as beta-blocker and high-dose statin therapy.  Plan "fast track" with likely discharge on Friday or Saturday.  2D echo is pending.   Quay Burow. MD, [facility] 11/30/2021 7:36 AM  Coronary Diagrams  Diagnostic Dominance: Right  Intervention    Patient Profile     51 y.o. male moderately overweight: Male with no prior cardiac history.  History of tobacco abuse, hypertension hyperlipidemia although he has not been taking a statin drug because of intolerance.  He presented with chest pain and was transferred from [facility].  Troponins gradually increased.  His EKG initially showed ST segment depression which normalized with IV heparin nitroglycerin.  Because of ongoing chest pain he was brought to the Cath Lab for  urgent angiography and intervention.  Assessment & Plan    1: Non-STEMI-Mr. Benjamin Huang had an occluded high large first diagonal branch.  His troponins were greater than 24,000.  He had PCI and drug-eluting stenting with a 3 mm x 16 mm long Synergy drug-eluting stent postdilated to 3.3 mm with excellent result.  He is on DAPT with aspirin Brilinta.  2: Essential hypertension-history of essential hypertension on amlodipine, losartan hydrochlorothiazide at home.  We guided beta-blocker as well.  He does have diastolic hypertension with while in the intensive care unit.  Will continue to monitor.  3: Hyperlipidemia-on atorvastatin at home with symptoms of statin and assess effectiveness.  4: Ischemic cardiomyopathy-EF initially by lethargy likely the time of intervention was 45 to 50% with an anterolateral wall motion abnormality.  Echo revealed normal LV function yesterday after his intervention without a total motion of the commonality.  Patient is postop day 1 anterolateral non-STEMI.  He is completely asymptomatic.  Okay to transfer to telemetry.  Ambulate with cardiac rehab.  Anticipate discharge tomorrow.  TOC 7 followed by return office visit with me in 4 to 6 weeks.  Will need to address prior authorization of his Brilinta.  For questions or updates, please contact [place] Please consult [email or web address] for contact info under        Signed, Quay Burow, MD  12/01/2021, 8:35 AM

## 2021-12-01 NOTE — Progress Notes (Signed)
1454: Attempt made to call report to 6E20. RN currently occupied in a medication pass. Instructed to call back momentarily. Patient resting comfortably. Vitals stable.  1505: Second attempt made to call report to 6E20 RN. Updated that RN was off the unit for meal break. Return number left with secretary and awaiting return call for handoff report and patient transfer. Patient resting comfortably. Vitals stable.

## 2021-12-01 NOTE — Research (Signed)
Spoke to patient about EVOLVE study. Patient said he would look it over and get back to me or said I could call him next week. I gave patient my cell phone number just in case he decided before he goes home tomorrow.        [place] Dashon Mcintire, Research Coordinator  12-01-2021 13:10

## 2021-12-02 ENCOUNTER — Other Ambulatory Visit: Payer: Self-pay | Admitting: Physician Assistant

## 2021-12-02 ENCOUNTER — Encounter (HOSPITAL_COMMUNITY): Payer: Self-pay | Admitting: Cardiovascular Disease

## 2021-12-02 DIAGNOSIS — E785 Hyperlipidemia, unspecified: Secondary | ICD-10-CM

## 2021-12-02 DIAGNOSIS — E876 Hypokalemia: Secondary | ICD-10-CM

## 2021-12-02 DIAGNOSIS — I10 Essential (primary) hypertension: Secondary | ICD-10-CM

## 2021-12-02 HISTORY — DX: Hyperlipidemia, unspecified: E78.5

## 2021-12-02 LAB — CBC
HCT: 44.4 % (ref 39.0–52.0)
Hemoglobin: 15.9 g/dL (ref 13.0–17.0)
MCH: 29.8 pg (ref 26.0–34.0)
MCHC: 35.8 g/dL (ref 30.0–36.0)
MCV: 83.3 fL (ref 80.0–100.0)
Platelets: 222 10*3/uL (ref 150–400)
RBC: 5.33 MIL/uL (ref 4.22–5.81)
RDW: 13.9 % (ref 11.5–15.5)
WBC: 9.9 10*3/uL (ref 4.0–10.5)
nRBC: 0 % (ref 0.0–0.2)

## 2021-12-02 LAB — BASIC METABOLIC PANEL
Anion gap: 8 (ref 5–15)
BUN: 12 mg/dL (ref 6–20)
CO2: 23 mmol/L (ref 22–32)
Calcium: 8.9 mg/dL (ref 8.9–10.3)
Chloride: 106 mmol/L (ref 98–111)
Creatinine, Ser: 0.94 mg/dL (ref 0.61–1.24)
GFR, Estimated: >60 mL/min (ref >60)
Glucose, Bld: 107 mg/dL — ABNORMAL HIGH (ref 70–99)
Potassium: 3.5 mmol/L (ref 3.5–5.1)
Sodium: 137 mmol/L (ref 135–145)

## 2021-12-02 LAB — LIPOPROTEIN A (LPA): Lipoprotein (a): 131.1 nmol/L — ABNORMAL HIGH (ref <75.0)

## 2021-12-02 MED ORDER — LOSARTAN POTASSIUM 100 MG PO TABS: 100.0000 mg | ORAL_TABLET | Freq: Every day | ORAL | 3 refills | Status: DC

## 2021-12-02 MED ORDER — METOPROLOL TARTRATE 25 MG PO TABS: 25.0000 mg | ORAL_TABLET | Freq: Two times a day (BID) | ORAL | 3 refills | Status: DC

## 2021-12-02 MED ORDER — TICAGRELOR 90 MG PO TABS: 90.0000 mg | ORAL_TABLET | Freq: Two times a day (BID) | ORAL | 3 refills | Status: DC

## 2021-12-02 MED ORDER — POTASSIUM CHLORIDE CRYS ER 20 MEQ PO TBCR: 20.0000 meq | EXTENDED_RELEASE_TABLET | Freq: Every day | ORAL | 11 refills | Status: DC

## 2021-12-02 MED ORDER — NITROGLYCERIN 0.4 MG SL SUBL: 0.4000 mg | SUBLINGUAL_TABLET | SUBLINGUAL | 11 refills | Status: DC | PRN

## 2021-12-02 MED ORDER — ACETAMINOPHEN 325 MG PO TABS: 650.0000 mg | ORAL_TABLET | ORAL | Status: AC | PRN

## 2021-12-02 MED ORDER — ROSUVASTATIN CALCIUM 40 MG PO TABS: 40.0000 mg | ORAL_TABLET | Freq: Every day | ORAL | 3 refills | Status: DC

## 2021-12-02 NOTE — Discharge Instructions (Signed)
Do not miss a dose of Brilinta or Aspirin. I have sent your prescriptions to [facility] on [address]. You can transfer these to the [facility] pharmacy for future refills. The Brilinta will be give to you before discharge (30 days). Our office will follow up on the prior authorization with your insurance. I sent your prescription for Brilinta to the [facility] pharmacy since you will have 30 days' worth when you leave the hospital.

## 2021-12-02 NOTE — H&P (Signed)
Spoke with [name] prior to discharge home. Denies having any TOC needs. Nursing already picked up meds from main pharmacy. [name] cousin will pick him up from hospital toda.    Benjamin Noble, MSN, RN,BSN Inpatient [facility] Case Manager [phone]

## 2021-12-02 NOTE — Discharge Summary (Addendum)
Discharge Summary    Patient ID: Benjamin Huang MRN: [id number]; DOB: 04-29-1970  Admit date: 11/29/2021 Discharge date: 12/02/2021  PCP:  [facility]   [facility] Providers Cardiologist:  None        Discharge Diagnoses    Principal Problem:   NSTEMI (non-ST elevated myocardial infarction) [facility]) Active Problems:   HTN (hypertension)   Hyperlipidemia LDL goal <70   Hypokalemia    Diagnostic Studies/Procedures    Coronary/Graft Acute MI Revascularization, Coronary/Graft Acute MI Revascularization, Coronary/Graft Acute MI Revascularization 11/30/2021   1st Diag lesion is 100% stenosed.   A drug-eluting stent was successfully placed using a SYNERGY XD 3.0X16.   Post intervention, there is a 0% residual stenosis.   There is mild to moderate left ventricular systolic dysfunction.   LV end diastolic pressure is mildly elevated.   The left ventricular ejection fraction is 45-50% by visual estimate.  Impression Successful high first diagonal branch PCI drug-eluting stenting in the setting of non-STEMI with ongoing chest pain.  The remainder of his coronary anatomy was free of significant obstructive disease.  His EF was probably in the 40 to 45% range with high Antero lateral hypokinesia.  We talked about the importance of smoking cessation.  He will need to be on dual antiplatelet therapy uninterrupted for at least 12 months (low-dose aspirin and Brilinta) as well as beta-blocker and high-dose statin therapy.  Plan "fast track" with likely discharge on Friday or Saturday.  2D echo is pending.      ECHO COMPLETE WO IMAGING ENHANCING AGENT 11/30/2021 1. Left ventricular ejection fraction, by estimation, is 60 to 65%. The left ventricle has normal function. The left ventricle has no regional wall motion abnormalities. There is moderate left ventricular hypertrophy. Left ventricular diastolic parameters were low normal for age. 2. Right ventricular  systolic function is normal. The right ventricular size is normal. Tricuspid regurgitation signal is inadequate for assessing PA pressure. 3. The mitral valve is normal in structure. Trivial mitral valve regurgitation. No evidence of mitral stenosis. 4. The aortic valve is tricuspid. Aortic valve regurgitation is trivial. No aortic stenosis is present. 5. Aortic dilatation noted. There is borderline dilatation of the ascending aorta, measuring 39 mm. 6. The inferior vena cava is dilated in size with <50% respiratory variability, suggesting right atrial pressure of 15 mmHg.    _____________   History of Present Illness     Benjamin Huang is a 51 y.o. male with hypertension, hyperlipidemia (intol to statins), tobacco abuse, hepatic steatosis, morbid obesity.  He presented to the [facility] emergency room with chest/epigastric pain associated with radiation to the right arm, shortness of breath and diaphoresis.  EKG demonstrated ST depression in the inferior leads.  His high sensitive troponin was elevated and continued to rise.  He was transferred to [facility] for further evaluation and management.  Hospital Course     Consultants: none    He was evaluated by cardiology at [facility].  Subsequent electrocardiograms demonstrated improved ST changes.  Despite treatment with heparin and IV nitroglycerin, he continued to have chest discomfort.  Urgent cardiac catheterization was arranged.  This demonstrated an occluded high large first diagonal which was treated with PCI and a drug-eluting stent.  EF was initially 40-45 with high anterolateral HK at cardiac catheterization.  Follow-up echocardiogram demonstrated normal LV function. He was doing well on 12/01/21 without any symptoms. He was started on ASA 81, Brilinta 90, Lopressor 25 and  Rosuvastatin 40. PA has been submitted for Brilinta.   He was evaluated this morning by Dr. Ladona Ridgel. He is doing well and is felt to be ready for DC to  home. His K+ has been low normal and we will start him on K+ 20 mEq once daily at DC with plans to repeat a BMET in 1 week.   Exam: Well appearing but overweight middle aged man, NAD' Lungs are clear CV with a RRR Ext - no edema.  Neuro - nonfocal.  Did the patient have an acute coronary syndrome (MI, NSTEMI, STEMI, etc) this admission?:  Yes                               AHA/ACC Clinical Performance & Quality Measures: Aspirin prescribed? - Yes ADP Receptor Inhibitor (Plavix/Clopidogrel, Brilinta/Ticagrelor or Effient/Prasugrel) prescribed (includes medically managed patients)? - Yes Beta Blocker prescribed? - Yes High Intensity Statin (Lipitor 40-80mg  or Crestor 20-40mg ) prescribed? - Yes EF assessed during THIS hospitalization? - Yes For EF <40%, was ACEI/ARB prescribed? - Yes For EF <40%, Aldosterone Antagonist (Spironolactone or Eplerenone) prescribed? - Not Applicable (EF >/= 40%) Cardiac Rehab Phase II ordered (including medically managed patients)? - Yes       The patient will be scheduled for a TOC follow up appointment in 7 days days.  A message has been sent to the [facility] and Scheduling Pool at the office where the patient should be seen for follow up.  _____________  Discharge Vitals Blood pressure (!) 141/90, pulse 81, temperature 98 F (36.7 C), temperature source Oral, resp. rate 18, height 6' (1.829 m), weight 134.5 kg, SpO2 97 %.  Filed Weights   11/29/21 2115 12/01/21 0600  Weight: 131.5 kg 134.5 kg    Labs & Radiologic Studies    CBC Recent Labs    11/29/21 2209 12/01/21 0627 12/02/21 0235  WBC 11.4* 9.3 9.9  NEUTROABS 8.5*  --   --   HGB 15.8 14.8 15.9  HCT 45.0 41.8 44.4  MCV 84.1 83.3 83.3  PLT 264 220 222   Basic Metabolic Panel Recent Labs    35/70/17 0016 12/01/21 0627 12/02/21 0235  NA  --  136 137  K  --  3.2* 3.5  CL  --  104 106  CO2  --  25 23  GLUCOSE  --  99 107*  BUN  --  7 12  CREATININE  --  0.93 0.94  CALCIUM  --   8.6* 8.9  MG 2.0  --   --    Liver Function Tests Recent Labs    11/29/21 2209  AST 31  ALT 38  ALKPHOS 100  BILITOT 0.7  PROT 8.3*  ALBUMIN 4.5    High Sensitivity Troponin:   Recent Labs  Lab 11/29/21 2209 11/30/21 0016 11/30/21 0302 12/01/21 0627  TROPONINIHS 95* 665* 2,406* >24,000*    Hemoglobin A1C Recent Labs    11/29/21 2209  HGBA1C 5.2   Fasting Lipid Panel Recent Labs    11/30/21 0302  CHOL 184  HDL 36*  LDLCALC 133*  TRIG 74  CHOLHDL 5.1    _____________   DG Chest Port 1 View Result Date: 11/29/2021 CLINICAL DATA:  Chest pain EXAM: PORTABLE CHEST 1 VIEW COMPARISON:  None Available. FINDINGS: The heart size and mediastinal contours are within normal limits. Both lungs are clear. The visualized skeletal structures are unremarkable.  IMPRESSION: No active disease.  Electronically  Signed   By: Helyn Numbers M.D.   On: 11/29/2021 22:03    Disposition   Pt is being discharged home today in good condition.  Follow-up Plans & Appointments     Discharge Instructions     Amb Referral to Cardiac Rehabilitation   Complete by: As directed    [place]   Diagnosis:  NSTEMI Coronary Stents     After initial evaluation and assessments completed: Virtual Based Care may be provided alone or in conjunction with Phase 2 Cardiac Rehab based on patient barriers.: Yes   Diet - low sodium heart healthy   Complete by: As directed    Discharge wound care:   Complete by: As directed    Call for swelling, bleeding, bruising or fever.   Driving Restrictions   Complete by: As directed    None for 1 week   Increase activity slowly   Complete by: As directed    Lifting restrictions   Complete by: As directed    None for 2 weeks.   Sexual Activity Restrictions   Complete by: As directed    None for 2 weeks       Discharge Medications   Allergies as of 12/02/2021   No Known Allergies      Medication List     TAKE these medications     acetaminophen 325 MG tablet Commonly known as: TYLENOL Take 2 tablets (650 mg total) by mouth every 4 (four) hours as needed for headache or mild pain.   amLODipine 10 MG tablet Commonly known as: NORVASC Take 10 mg by mouth daily.   Aspirin Low Dose 81 MG chewable tablet Generic drug: aspirin Chew 1 tablet (81 mg total) by mouth daily.   FISH OIL PO Take 1 capsule by mouth daily.   hydrochlorothiazide 25 MG tablet Commonly known as: HYDRODIURIL Take 25 mg by mouth daily.   losartan 100 MG tablet Commonly known as: COZAAR Take 1 tablet (100 mg total) by mouth daily. What changed:  medication strength how much to take Another medication with the same name was removed. Continue taking this medication, and follow the directions you see here.   metoprolol tartrate 25 MG tablet Commonly known as: LOPRESSOR Take 1 tablet (25 mg total) by mouth 2 (two) times daily.   nitroGLYCERIN 0.4 MG SL tablet Commonly known as: NITROSTAT Place 1 tablet (0.4 mg total) under the tongue every 5 (five) minutes x 3 doses as needed for chest pain.   potassium chloride SA 20 MEQ tablet Commonly known as: KLOR-CON M Take 1 tablet (20 mEq total) by mouth daily.   rosuvastatin 40 MG tablet Commonly known as: CRESTOR Take 1 tablet (40 mg total) by mouth daily.   ticagrelor 90 MG Tabs tablet Commonly known as: BRILINTA Take 1 tablet (90 mg total) by mouth 2 (two) times daily.           Outstanding Labs/Studies   BMET in 1 week Lipids and LFTs in 6-8 weeks.   Duration of Discharge Encounter   Greater than 30 minutes including physician time.  Signed, Lewayne Bunting, MD 12/02/2021, 10:26 AM

## 2021-12-04 NOTE — Telephone Encounter (Signed)
Patient Advocate Encounter  Received notification that the request for prior authorization for Brilinta 90 mg tablets  has been denied due to There must be documentation of clinical significant treatment, failure, intolerance to or contraindication to all formulary platelet aggregation inhibitors including prasugrel and clopidogrel.       Roland Earl, CPhT Pharmacy Patient Advocate Specialist [facility] Patient Advocate Team Direct Number: [phone]  Fax: [phone]

## 2021-12-05 NOTE — Telephone Encounter (Signed)
Patient Advocate Encounter  Received notification that the request for prior authorization for icosapent ethyl (Vascepa) 1 g has been denied due to For Treatment of hypertriglyceridemia, there must be clinical documentation showing ineffective lowering of triglyceride levels with lifestyle interventions in combinations with optimal statin therapy and fibrate therapy.     Roland Earl, CPhT Pharmacy Patient Advocate Specialist [facility] Patient Advocate Team Direct Number: [phone]  Fax: [phone]

## 2021-12-08 NOTE — Progress Notes (Unsigned)
Cardiology Clinic Note   Patient Name: Benjamin Huang Date of Encounter: 12/11/2021  Primary Care Provider:  [facility] Primary Cardiologist:  Nanetta Batty, MD  Patient Profile    Benjamin Huang 51 year old male presents the clinic today for follow-up evaluation of his coronary artery disease status post NSTEMI.  Past Medical History    Past Medical History:  Diagnosis Date   HTN (hypertension)    Hyperlipidemia LDL goal <70 12/02/2021   Obesity, morbid, BMI 40.0-49.9 (HCC)    Past Surgical History:  Procedure Laterality Date   CORONARY STENT INTERVENTION N/A 11/30/2021   Procedure: CORONARY STENT INTERVENTION;  Surgeon: Runell Gess, MD;  Location: [facility] INVASIVE CV LAB;  Service: Cardiovascular;  Laterality: N/A;   CORONARY/GRAFT ACUTE MI REVASCULARIZATION N/A 11/30/2021   Procedure: Coronary/Graft Acute MI Revascularization;  Surgeon: Runell Gess, MD;  Location: [facility] INVASIVE CV LAB;  Service: Cardiovascular;  Laterality: N/A;   LEFT HEART CATH AND CORONARY ANGIOGRAPHY N/A 11/30/2021   Procedure: LEFT HEART CATH AND CORONARY ANGIOGRAPHY;  Surgeon: Runell Gess, MD;  Location: [facility] INVASIVE CV LAB;  Service: Cardiovascular;  Laterality: N/A;    Allergies  No Known Allergies  History of Present Illness    Benjamin Huang has a PMH of coronary artery disease status post NSTEMI with PCI and DES to his first diagonal, hyperlipidemia, hypertension, tobacco abuse, and ischemic cardiomyopathy.  He presented to the emergency department on 11/30/2021 and was discharged on 12/02/2021.  He was taken to the Cath Lab on 11/30/2021 with chest pain.  He was noted to have slowly rising troponins.  His angiography showed 100% occluded D1.  He received PCI with DES x1.  At the time of his invention he was felt to have an EF of 45-50%.  His echocardiogram 11/30/2021 showed an EF of 60-65%, moderate left ventricular hypertrophy and trivial mitral valve  regurgitation as well as trivial aortic valve regurgitation.  He was noted to have borderline aortic dilation measuring 39 mm.  He presents to the clinic today for follow-up evaluation and states he is feeling much better after his heart cath.  Says he has not felt this good in several months.  Denies any chest pain, edema, fatigue, palpitations, syncope/presyncope, weakness, diaphoresis, hematuria, hemoptysis, orthopnea, PND.  Admits to some bruising on his left forearm after starting Brilinta.  Right radial site is healing well per his report.  Does state he has some nausea after taking his morning medications the last for 5 minutes.  Also experiences slight headache after taking his medications and a little shortness of breath, which is stable.  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  He has quit smoking cigarettes and cigars.  Denies any alcohol or illicit drug use.  Denies any other questions or concerns today.  Home Medications    Prior to Admission medications   Medication Sig Start Date End Date Taking? Authorizing Provider  acetaminophen (TYLENOL) 325 MG tablet Take 2 tablets (650 mg total) by mouth every 4 (four) hours as needed for headache or mild pain. 12/02/21   Tereso Newcomer T, PA-C  amLODipine (NORVASC) 10 MG tablet Take 10 mg by mouth daily.    [provider]  aspirin 81 MG chewable tablet Chew 1 tablet (81 mg total) by mouth daily. 12/02/21   Arty Baumgartner, NP  hydrochlorothiazide (HYDRODIURIL) 25 MG tablet Take 25 mg by mouth daily.    [provider]  losartan (  COZAAR) 100 MG tablet Take 1 tablet (100 mg total) by mouth daily. 12/02/21   Tereso Newcomer T, PA-C  metoprolol tartrate (LOPRESSOR) 25 MG tablet Take 1 tablet (25 mg total) by mouth 2 (two) times daily. 12/02/21   Tereso Newcomer T, PA-C  nitroGLYCERIN (NITROSTAT) 0.4 MG SL tablet Place 1 tablet (0.4 mg total) under the tongue every 5 (five) minutes x 3 doses as needed for chest pain. 12/02/21   Tereso Newcomer T, PA-C  Omega-3 Fatty Acids (FISH OIL PO) Take 1 capsule by mouth daily.    [provider]  potassium chloride SA (KLOR-CON M) 20 MEQ tablet Take 1 tablet (20 mEq total) by mouth daily. 12/02/21   Tereso Newcomer T, PA-C  rosuvastatin (CRESTOR) 40 MG tablet Take 1 tablet (40 mg total) by mouth daily. 12/02/21   Tereso Newcomer T, PA-C  ticagrelor (BRILINTA) 90 MG TABS tablet Take 1 tablet (90 mg total) by mouth 2 (two) times daily. 12/02/21   Beatrice Lecher, PA-C    Family History    Family History  Problem Relation Age of Onset   Colon cancer Neg Hx    Liver disease Neg Hx    He indicated that the status of his neg hx is unknown.  Social History    Social History   Socioeconomic History   Marital status: Single    Spouse name: Not on file   Number of children: Not on file   Years of education: Not on file   Highest education level: Not on file  Occupational History   Not on file  Tobacco Use   Smoking status: Some Days    Types: Cigars   Smokeless tobacco: Never   Tobacco comments:    cigar every now and then  Substance and Sexual Activity   Alcohol use: Yes    Comment: occas   Drug use: No   Sexual activity: Not on file  Other Topics Concern   Not on file  Social History Narrative   Not on file   Social Determinants of Health   Financial Resource Strain: Not on file  Food Insecurity: Not on file  Transportation Needs: Not on file  Physical Activity: Not on file  Stress: Not on file  Social Connections: Not on file  Intimate Partner Violence: Not on file     Review of Systems    General:  No chills, fever, night sweats or weight changes.  Cardiovascular:  No chest pain, dyspnea on exertion, edema, orthopnea, palpitations, paroxysmal nocturnal dyspnea. Dermatological: No rash, lesions/masses Respiratory: No cough, dyspnea Urologic: No hematuria, dysuria Abdominal: Some mild nausea after taking morning medications, last for 5 minutes.  No  vomiting, diarrhea, bright red blood per rectum, melena, or hematemesis Neurologic:  No visual changes, wkns, changes in mental status. All other systems reviewed and are otherwise negative except as noted above.  Physical Exam    VS:  BP (!) 135/90   Pulse 71   Ht 6' (1.829 m)   Wt 296 lb 6.4 oz (134.4 kg)   BMI 40.20 kg/m  , BMI Body mass index is 40.2 kg/m. GEN: Well nourished, well developed, in no acute distress. HEENT: normal. Neck: Supple, no JVD, carotid bruits, or masses. Cardiac: RRR, no murmurs, rubs, or gallops. No clubbing, cyanosis, edema.  Radials/DP/PT 2+ and equal bilaterally.  Right radial site is clean, dry, intact, without any erythema, swelling, or bruising noted. Respiratory:  Respirations regular and unlabored, clear to auscultation bilaterally. MS:  no deformity or atrophy. Skin: warm and dry, no rash.  Some slight bruising noted to left medial forearm. Neuro:  Strength and sensation are intact. Psych: Normal affect.  Accessory Clinical Findings    Recent Labs: 11/29/2021: ALT 38 11/30/2021: Magnesium 2.0 12/02/2021: BUN 12; Creatinine, Ser 0.94; Hemoglobin 15.9; Platelets 222; Potassium 3.5; Sodium 137   Recent Lipid Panel    Component Value Date/Time   CHOL 184 11/30/2021 0302   TRIG 74 11/30/2021 0302   HDL 36 (L) 11/30/2021 0302   CHOLHDL 5.1 11/30/2021 0302   VLDL 15 11/30/2021 0302   LDLCALC 133 (H) 11/30/2021 0302    HYPERTENSION CONTROL Vitals:   12/11/21 1057 12/11/21 1132  BP: (!) 142/90 (!) 135/90    The patient's blood pressure is elevated above target today.  In order to address the patient's elevated BP: Blood pressure will be monitored at home to determine if medication changes need to be made.       ECG personally reviewed by me today-normal sinus rhythm, 73 bpm, incomplete right bundle branch block, nonspecific ST and T wave abnormality.- No acute changes  Echocardiogram 11/30/2021  IMPRESSIONS     1. Left ventricular  ejection fraction, by estimation, is 60 to 65%. The  left ventricle has normal function. The left ventricle has no regional  wall motion abnormalities. There is moderate left ventricular hypertrophy.  Left ventricular diastolic  parameters were low normal for age.   2. Right ventricular systolic function is normal. The right ventricular  size is normal. Tricuspid regurgitation signal is inadequate for assessing  PA pressure.   3. The mitral valve is normal in structure. Trivial mitral valve  regurgitation. No evidence of mitral stenosis.   4. The aortic valve is tricuspid. Aortic valve regurgitation is trivial.  No aortic stenosis is present.   5. Aortic dilatation noted. There is borderline dilatation of the  ascending aorta, measuring 39 mm.   6. The inferior vena cava is dilated in size with <50% respiratory  variability, suggesting right atrial pressure of 15 mmHg.   Cardiac catheterization 11/30/2021  IMPRESSION: Successful high first diagonal branch PCI drug-eluting stenting in the setting of non-STEMI with ongoing chest pain.  The remainder of his coronary anatomy was free of significant obstructive disease.  His EF was probably in the 40 to 45% range with high Antero lateral hypokinesia.  We talked about the importance of smoking cessation.  He will need to be on dual antiplatelet therapy uninterrupted for at least 12 months (low-dose aspirin and Brilinta) as well as beta-blocker and high-dose statin therapy.  Plan "fast track" with likely discharge on Friday or Saturday.  2D echo is pending.   Nanetta Batty. MD, [facility] 11/30/2021 7:36 AM  Diagnostic Dominance: Right  Intervention    Assessment & Plan   1.  CAD, recent NSTEMI, s/p DES first diagonal branch, hyperlipidemia with LDL goal less than 70-chronic, stable No recent angina or chest pain.  No need for further ischemic evaluation as he just had heart cath with recent PCI DES to first diagonal branch.  Continue aspirin 81  mg daily and Brilinta 90 mg twice daily for up to 1 year.  Twelve-lead EKG does show incomplete right bundle branch block, nonspecific ST and T wave abnormality.  He is okay to start cardiac rehab.  Last LDL was 133 on November 30, 2021.  He was started on Crestor 40 mg daily.  We will recheck fasting lipid panel and liver enzymes in 2 months.  Discussed conservative measures to improve nausea after taking morning medications, I stated he will need to eat breakfast and then take his medications.  Also stated to drink a few sips of caffeine after taking Brilinta to minimize shortness of breath after taking his medication.  2.  Hypertension-chronic, stable Initial blood pressure on arrival 142/90.  Repeat manual blood pressure checked by me 135/90.  States his blood pressure at home is checked with a wrist cuff, and ranges between low 130s to 140s systolic.  Discussed to obtain Omron cuff with larger extra-large cuff and discussed to monitor BP at home at least 2 hours after medications and sitting for 5-10 minutes.  Given blood pressure log and will monitor this at home for 2 weeks and he will let me know his readings.  If blood pressure is still not at goal less than 130/80 plan to increase metoprolol tartrate from 25 mg twice daily to 50 mg twice daily.  We will obtain BMET today.  3.  Tobacco abuse-chronic, resolved He has quit smoking cigars and cigarettes.  I congratulated him.  4.  Disposition: Follow-up in 4 to 6 months with Dr. Allyson Sabal or sooner if anything changes.  Signed,  Sharlene Dory, NP     12/11/2021, 11:34 AM [facility] [address] Office [phone] Fax [phone]  Notice: This dictation was prepared with Dragon dictation along with smaller phrase technology. Any transcriptional errors that result from this process are unintentional and may not be corrected upon review.  I spent 20 minutes examining this patient, reviewing medications,  and using patient centered shared decision making involving her cardiac care.  Prior to her visit I spent greater than 20 minutes reviewing her past medical history,  medications, and prior cardiac tests.

## 2021-12-11 ENCOUNTER — Encounter: Payer: Self-pay | Admitting: Nurse Practitioner

## 2021-12-11 ENCOUNTER — Ambulatory Visit (INDEPENDENT_AMBULATORY_CARE_PROVIDER_SITE_OTHER): Payer: PRIVATE HEALTH INSURANCE | Admitting: Nurse Practitioner

## 2021-12-11 VITALS — BP 135/90 | HR 71 | Ht 72.0 in | Wt 296.4 lb

## 2021-12-11 DIAGNOSIS — I2510 Atherosclerotic heart disease of native coronary artery without angina pectoris: Secondary | ICD-10-CM

## 2021-12-11 DIAGNOSIS — I10 Essential (primary) hypertension: Secondary | ICD-10-CM

## 2021-12-11 DIAGNOSIS — Z720 Tobacco use: Secondary | ICD-10-CM

## 2021-12-11 DIAGNOSIS — E785 Hyperlipidemia, unspecified: Secondary | ICD-10-CM | POA: Diagnosis not present

## 2021-12-11 DIAGNOSIS — I214 Non-ST elevation (NSTEMI) myocardial infarction: Secondary | ICD-10-CM | POA: Diagnosis not present

## 2021-12-11 LAB — BASIC METABOLIC PANEL
BUN/Creatinine Ratio: 13 (ref 9–20)
BUN: 12 mg/dL (ref 6–24)
CO2: 24 mmol/L (ref 20–29)
Calcium: 9.7 mg/dL (ref 8.7–10.2)
Chloride: 102 mmol/L (ref 96–106)
Creatinine, Ser: 0.96 mg/dL (ref 0.76–1.27)
Glucose: 106 mg/dL — ABNORMAL HIGH (ref 70–99)
Potassium: 3.9 mmol/L (ref 3.5–5.2)
Sodium: 139 mmol/L (ref 134–144)
eGFR: 96 mL/min/{1.73_m2} (ref >59)

## 2021-12-11 NOTE — Patient Instructions (Addendum)
Medication Instructions:  The current medical regimen is effective;  continue present plan and medications as directed. Please refer to the Current Medication list given to you today.   *If you need a refill on your cardiac medications before your next appointment, please call your pharmacy*  Lab Work:   Testing/Procedures:  BMET TODAY  AND   NONE FASTING LIPID AND LFT IN 2 MONTHS    If you have labs (blood work) drawn today and your tests are completely normal, you will receive your results only by:  1-MyChart Message (if you have MyChart) OR  2-A paper copy in the mail.  If you have any lab test that is abnormal or we need to change your treatment, we will call you to review the results.  Special Instructions TAKE AND LOG YOUR BLOOD PRESSURE-GET OMRON BLOOD PRESSURE MACHINE WITH A LARGE CUFF   Follow-Up: Your next appointment:  4-6 month(s) In Person with Nanetta Batty, MD    At [facility], you and your health needs are our priority.  As part of our continuing mission to provide you with exceptional heart care, we have created designated Provider Care Teams.  These Care Teams include your primary Cardiologist (physician) and Advanced Practice Providers (APPs -  Physician Assistants and Nurse Practitioners) who all work together to provide you with the care you need, when you need it.  Important Information About Sugar

## 2021-12-14 ENCOUNTER — Telehealth (HOSPITAL_COMMUNITY): Payer: Self-pay

## 2021-12-14 NOTE — Telephone Encounter (Signed)
Per phase I cardiac rehab, fax cardiac rehab referral to [place]. 

## 2021-12-18 ENCOUNTER — Telehealth: Payer: Self-pay | Admitting: Cardiovascular Disease

## 2021-12-18 NOTE — Telephone Encounter (Signed)
Pt c/o medication issue:  1. Name of Medication:   ticagrelor (BRILINTA) 90 MG TABS tablet    2. How are you currently taking this medication (dosage and times per day)? Take 1 tablet (90 mg total) by mouth 2 (two) times daily.  3. Are you having a reaction (difficulty breathing--STAT)? No  4. What is your medication issue? Pt would like a callback regarding medication approval. He states that he received a letter from his insurance regarding medication. Please advise

## 2021-12-18 NOTE — Telephone Encounter (Signed)
Returned call to patient, patient reports he got a letter in the mail from insurance stating his Marden Noble was denied.   He currently has a month supply.     Advised would send message to Dr. Allyson Sabal and his nurse for further review.     Per chart review: telephone note 8/10 Patient Advocate Encounter   Received notification that the request for prior authorization for Brilinta 90 mg tablets  has been denied due to There must be documentation of clinical significant treatment, failure, intolerance to or contraindication to all formulary platelet aggregation inhibitors including prasugrel and clopidogrel.         Roland Earl, CPhT Pharmacy Patient Advocate Specialist [facility] Patient Advocate Team Direct Number: [phone]  Fax: [phone]

## 2021-12-22 NOTE — Telephone Encounter (Signed)
Pt is returning call. Transferred to [place], RN 

## 2021-12-22 NOTE — Telephone Encounter (Signed)
Left message for pt to call back  °

## 2021-12-22 NOTE — Telephone Encounter (Signed)
Pt called back to discuss Brilinta denial. Pt is not home at the moment but when he gets home he will send more information regarding denial letter. Advised pt that insurance information, reference number and ID number would be helpful in the appeal process. Instructed pt that he can send this info in via [place]. Pt states that he as about a week and 1/2 left of medication, will provide samples if needed. Pt verbalizes understanding.

## 2021-12-28 MED ORDER — ROSUVASTATIN CALCIUM 40 MG PO TABS: 40.0000 mg | ORAL_TABLET | Freq: Every day | ORAL | 3 refills | Status: DC

## 2021-12-28 MED ORDER — POTASSIUM CHLORIDE CRYS ER 20 MEQ PO TBCR: 20.0000 meq | EXTENDED_RELEASE_TABLET | Freq: Every day | ORAL | 3 refills | Status: DC

## 2021-12-28 MED ORDER — LOSARTAN POTASSIUM 100 MG PO TABS: 100.0000 mg | ORAL_TABLET | Freq: Every day | ORAL | 3 refills | Status: DC

## 2021-12-28 MED ORDER — AMLODIPINE BESYLATE 10 MG PO TABS: 10.0000 mg | ORAL_TABLET | Freq: Every day | ORAL | 3 refills | Status: DC

## 2021-12-28 MED ORDER — METOPROLOL TARTRATE 25 MG PO TABS: 25.0000 mg | ORAL_TABLET | Freq: Two times a day (BID) | ORAL | 3 refills | Status: DC

## 2021-12-28 MED ORDER — HYDROCHLOROTHIAZIDE 25 MG PO TABS: 25.0000 mg | ORAL_TABLET | Freq: Every day | ORAL | 3 refills | Status: DC

## 2021-12-28 NOTE — Telephone Encounter (Signed)
Spoke with pt regarding appeal to his insurance sent in today. Pt states that he has enough Brilinta to get through Monday. Will put samples at the front desk for pt to pick up. While on the phone with pt, he mentions needing refills of his cardiac medications. Will send refills to pharmacy of choice. Pt verbalizes understanding.    Medication samples have been provided to the patient.  Drug name: Brilinta 90mg  Qty: 3 bottles LOT: Exp.Date: 01/2023  Samples left at front desk for patient pick-up. Patient notified.

## 2021-12-28 NOTE — Telephone Encounter (Addendum)
Appeal application and supporting documentation faxed to [place]. Confirmation receipt for fax received.

## 2022-01-01 NOTE — Telephone Encounter (Signed)
Called pt's insurance appeal dept, spoke with Deanna Artis, who states that pt is approved to have Brilinta 90mg  for a full calendar year until 01/02/2023. Will notify pt of this.   Left message for pt stating that insurance approved for him to have Brilinta 90mg  for full year. Left number for pt if he has any questions.

## 2022-01-04 ENCOUNTER — Telehealth: Payer: Self-pay | Admitting: Cardiovascular Disease

## 2022-01-04 NOTE — Telephone Encounter (Signed)
Called pt to relay the message from [place], pharmacist. Pt instructed to follow instructions on the box/bottle. He verbalized understanding and thanked me for calling him back.

## 2022-01-04 NOTE — Telephone Encounter (Signed)
Pt c/o medication issue:  1. Name of Medication:    2. How are you currently taking this medication (dosage and times per day)?    3. Are you having a reaction (difficulty breathing--STAT)? no  4. What is your medication issue? Calling to see if its okay that he take a laxative with his medication. Please advise

## 2022-01-04 NOTE — Telephone Encounter (Signed)
Yes, should be fine.

## 2022-01-04 NOTE — Telephone Encounter (Signed)
Called pt to see which medication he is referring to. He states "I just feel bloated, and gassy. I kind of ate wrong on Sunday and I have been paying for it all week." Pt wants to know if the side effects of the medications will go away or will he have to deal with them, "stomach fluttering". Pt wants to know is it okay to take a laxative to help with the bloating and prevent a blockage.  Will get message to pharmacy staff for recommendations.

## 2022-01-10 ENCOUNTER — Ambulatory Visit: Payer: Self-pay | Admitting: Cardiovascular Disease

## 2022-02-08 ENCOUNTER — Encounter: Payer: Self-pay | Admitting: Cardiovascular Disease

## 2022-02-08 ENCOUNTER — Telehealth: Payer: Self-pay | Admitting: Cardiovascular Disease

## 2022-02-08 NOTE — Telephone Encounter (Signed)
Patient called and said that insurance sent over a fax into our office Tuesday and wanted to know if it was received. Please call back to verify.

## 2022-02-08 NOTE — Telephone Encounter (Signed)
Patient is returning call.  °

## 2022-02-08 NOTE — Telephone Encounter (Signed)
Spoke with pt regarding critical illness insurance policy that he has been trying to get to [place]. Pt states that it was faxed on Tuesday of this week but I have yet to receive it. Instructed pt on how to send via Mychart. Pt able to send to mychart and I confirmed that we received it. Pt verbalizes understanding.

## 2022-02-08 NOTE — Telephone Encounter (Signed)
Left message for pt to call back  °

## 2022-02-12 ENCOUNTER — Telehealth: Payer: Self-pay | Admitting: Cardiovascular Disease

## 2022-02-12 NOTE — Telephone Encounter (Signed)
Pt calling to f/u on Insurance paperwork that was sent via [place] on 10/19. Pt would also like a callback regarding his medications. Please advise

## 2022-02-12 NOTE — Telephone Encounter (Signed)
Spoke to patient advised Dr.Berry's RN faxed insurance paper work this past Friday.Stated he would like a copy mailed to his home.I will send message to her.

## 2022-02-15 NOTE — Telephone Encounter (Signed)
Copy made of insurance paperwork and mailed to pt's home address per previous request.

## 2022-02-20 ENCOUNTER — Telehealth: Payer: Self-pay | Admitting: Cardiovascular Disease

## 2022-02-20 NOTE — Telephone Encounter (Signed)
Pt called in stated he missed his dose of Brilinta last night.  He feel fine the morning but just wanted Dr Gwenlyn Found to know. He fell asleep and missed it.  Denies any symptoms at this time   Best number [phone]

## 2022-02-20 NOTE — Telephone Encounter (Signed)
Patient should resume his normal schedule. Do not double dose.

## 2022-02-21 NOTE — Telephone Encounter (Signed)
Called patient, advised of message from [facility] below.  Thank you!

## 2022-04-10 ENCOUNTER — Ambulatory Visit: Payer: PRIVATE HEALTH INSURANCE | Attending: Cardiovascular Disease | Admitting: Cardiovascular Disease

## 2022-04-10 ENCOUNTER — Encounter: Payer: Self-pay | Admitting: Cardiovascular Disease

## 2022-04-10 VITALS — BP 136/74 | HR 44 | Ht 72.0 in | Wt 300.0 lb

## 2022-04-10 DIAGNOSIS — I214 Non-ST elevation (NSTEMI) myocardial infarction: Secondary | ICD-10-CM | POA: Diagnosis not present

## 2022-04-10 DIAGNOSIS — I10 Essential (primary) hypertension: Secondary | ICD-10-CM

## 2022-04-10 DIAGNOSIS — E785 Hyperlipidemia, unspecified: Secondary | ICD-10-CM | POA: Diagnosis not present

## 2022-04-10 LAB — HEPATIC FUNCTION PANEL
ALT: 17 IU/L (ref 0–44)
AST: 21 IU/L (ref 0–40)
Albumin: 4.3 g/dL (ref 3.8–4.9)
Alkaline Phosphatase: 120 IU/L (ref 44–121)
Bilirubin Total: 0.7 mg/dL (ref 0.0–1.2)
Bilirubin, Direct: 0.21 mg/dL (ref 0.00–0.40)
Total Protein: 6.7 g/dL (ref 6.0–8.5)

## 2022-04-10 LAB — LIPID PANEL
Chol/HDL Ratio: 2.6 ratio (ref 0.0–5.0)
Cholesterol, Total: 90 mg/dL — ABNORMAL LOW (ref 100–199)
HDL: 34 mg/dL — ABNORMAL LOW (ref >39)
LDL Chol Calc (NIH): 43 mg/dL (ref 0–99)
Triglycerides: 56 mg/dL (ref 0–149)
VLDL Cholesterol Cal: 13 mg/dL (ref 5–40)

## 2022-04-10 NOTE — Assessment & Plan Note (Signed)
History of hyperlipidemia on high-dose rosuvastatin with lipid profile performed at the time of his myocardial infarction 11/30/2021 revealing total cholesterol 184, LDL 133 and HDL 36.  We will recheck a lipid and liver profile today.

## 2022-04-10 NOTE — Assessment & Plan Note (Signed)
History of essential hypertension a blood pressure measured today at 136/74.  He is on amlodipine, hydrochlorothiazide, losartan and metoprolol.

## 2022-04-10 NOTE — Progress Notes (Signed)
04/10/2022 [name]   March 30, 1971  [id number]  Primary Physician [facility] Primary Cardiologist: Runell Gess MD Milagros Loll, [place], [place]  HPI:  Benjamin Huang is a 51 y.o. moderately overweight single African-American male father of 1 son who works in Designer, industrial/product for [employer].  He presented at 3 AM on 11/30/2021 with chest pain.  ST segment depression.  Because of ongoing chest pain and is rising troponins I took him to the Cath Lab revealing an occluded moderate-sized high first diagonal branch which I stented with a 3 mm x 60 mm long Synergy drug-eluting stent postdilated to 3.3 mm.  Other problems include tobacco abuse, hypertension and hyperlipidemia.  He has since stopped smoking.  He denies chest pain.  He remains on dual antiplatelet therapy.   Current Meds  Medication Sig   acetaminophen (TYLENOL) 325 MG tablet Take 2 tablets (650 mg total) by mouth every 4 (four) hours as needed for headache or mild pain.   amLODipine (NORVASC) 10 MG tablet Take 1 tablet (10 mg total) by mouth daily.   aspirin 81 MG chewable tablet Chew 1 tablet (81 mg total) by mouth daily.   hydrochlorothiazide (HYDRODIURIL) 25 MG tablet Take 1 tablet (25 mg total) by mouth daily.   losartan (COZAAR) 100 MG tablet Take 1 tablet (100 mg total) by mouth daily.   metoprolol tartrate (LOPRESSOR) 25 MG tablet Take 1 tablet (25 mg total) by mouth 2 (two) times daily.   nitroGLYCERIN (NITROSTAT) 0.4 MG SL tablet Place 1 tablet (0.4 mg total) under the tongue every 5 (five) minutes x 3 doses as needed for chest pain.   Omega-3 Fatty Acids (FISH OIL PO) Take 1 capsule by mouth daily.   potassium chloride SA (KLOR-CON M) 20 MEQ tablet Take 1 tablet (20 mEq total) by mouth daily.   rosuvastatin (CRESTOR) 40 MG tablet Take 1 tablet (40 mg total) by mouth daily.   ticagrelor (BRILINTA) 90 MG TABS tablet Take 1 tablet (90 mg total) by mouth 2 (two) times daily.     No Known  Allergies  Social History   Socioeconomic History   Marital status: Single    Spouse name: Not on file   Number of children: Not on file   Years of education: Not on file   Highest education level: Not on file  Occupational History   Not on file  Tobacco Use   Smoking status: Some Days    Types: Cigars   Smokeless tobacco: Never   Tobacco comments:    cigar every now and then  Substance and Sexual Activity   Alcohol use: Yes    Comment: occas   Drug use: No   Sexual activity: Not on file  Other Topics Concern   Not on file  Social History Narrative   Not on file   Social Determinants of Health   Financial Resource Strain: Not on file  Food Insecurity: Not on file  Transportation Needs: Not on file  Physical Activity: Not on file  Stress: Not on file  Social Connections: Not on file  Intimate Partner Violence: Not on file     Review of Systems: General: negative for chills, fever, night sweats or weight changes.  Cardiovascular: negative for chest pain, dyspnea on exertion, edema, orthopnea, palpitations, paroxysmal nocturnal dyspnea or shortness of breath Dermatological: negative for rash Respiratory: negative for cough or wheezing Urologic: negative for hematuria Abdominal: negative for nausea, vomiting, diarrhea, bright  red blood per rectum, melena, or hematemesis Neurologic: negative for visual changes, syncope, or dizziness All other systems reviewed and are otherwise negative except as noted above.    Blood pressure 136/74, pulse (!) 44, height 6' (1.829 m), weight 300 lb (136.1 kg), SpO2 99 %.  General appearance: alert and no distress Neck: no adenopathy, no carotid bruit, no JVD, supple, symmetrical, trachea midline, and thyroid not enlarged, symmetric, no tenderness/mass/nodules Lungs: clear to auscultation bilaterally Heart: regular rate and rhythm, S1, S2 normal, no murmur, click, rub or gallop Extremities: extremities normal, atraumatic, no cyanosis  or edema Pulses: 2+ and symmetric Skin: Skin color, texture, turgor normal. No rashes or lesions Neurologic: Grossly normal  EKG not performed today  ASSESSMENT AND PLAN:   HTN (hypertension) History of essential hypertension a blood pressure measured today at 136/74.  He is on amlodipine, hydrochlorothiazide, losartan and metoprolol.  Hyperlipidemia LDL goal <70 History of hyperlipidemia on high-dose rosuvastatin with lipid profile performed at the time of his myocardial infarction 11/30/2021 revealing total cholesterol 184, LDL 133 and HDL 36.  We will recheck a lipid and liver profile today.  NSTEMI (non-ST elevated myocardial infarction) (HCC) History of STEMI 11/30/2021 with EKG that showed ST segment depression.  Because of ongoing chest pain with slightly rising troponins I took him to the Cath Lab revealing occluded fairly large high diagonal branch which I stented with a 3 mm x 16 mm long Synergy drug-eluting stent postdilated to 3.3 mm.  His EF was preserved.  He had no other significant CAD.  He remains on aspirin and Brilinta.     Runell Gess MD FACP,FACC,FAHA, [facility] 04/10/2022 10:23 AM

## 2022-04-10 NOTE — Patient Instructions (Signed)
Medication Instructions:  Your physician recommends that you continue on your current medications as directed. Please refer to the Current Medication list given to you today.  *If you need a refill on your cardiac medications before your next appointment, please call your pharmacy*   Lab Work: Your physician recommends that you have labs drawn today: Lipid/liver panel  If you have labs (blood work) drawn today and your tests are completely normal, you will receive your results only by: MyChart Message (if you have MyChart) OR A paper copy in the mail If you have any lab test that is abnormal or we need to change your treatment, we will call you to review the results.   Follow-Up: At [facility], you and your health needs are our priority.  As part of our continuing mission to provide you with exceptional heart care, we have created designated Provider Care Teams.  These Care Teams include your primary Cardiologist (physician) and Advanced Practice Providers (APPs -  Physician Assistants and Nurse Practitioners) who all work together to provide you with the care you need, when you need it.  We recommend signing up for the patient portal called "MyChart".  Sign up information is provided on this After Visit Summary.  MyChart is used to connect with patients for Virtual Visits (Telemedicine).  Patients are able to view lab/test results, encounter notes, upcoming appointments, etc.  Non-urgent messages can be sent to your provider as well.   To learn more about what you can do with MyChart, go to [email or web address].    Your next appointment:   6 month(s)  The format for your next appointment:   In Person  Provider:   Micah Flesher, PA-C, Marjie Skiff, PA-C, Juanda [place], PA-C, Joni Reining, DNP, ANP, Azalee Course, PA-C, or Bernadene Person, NP       Then, Nanetta Batty, MD will plan to see you again in 12 month(s).

## 2022-04-10 NOTE — Assessment & Plan Note (Signed)
History of STEMI 11/30/2021 with EKG that showed ST segment depression.  Because of ongoing chest pain with slightly rising troponins I took him to the Cath Lab revealing occluded fairly large high diagonal branch which I stented with a 3 mm x 16 mm long Synergy drug-eluting stent postdilated to 3.3 mm.  His EF was preserved.  He had no other significant CAD.  He remains on aspirin and Brilinta.

## 2022-04-14 ENCOUNTER — Telehealth: Payer: Self-pay | Admitting: Cardiology

## 2022-04-14 NOTE — Telephone Encounter (Signed)
Patient called and stated that he ran out of Brilinta 90mg  BID.  His pharmacy is closed until Tuesday.  I have called in Brilinta 90mg  1 tablet BID # 6 tablets with 0 refills to [facility] on [address] [place] and then he will get his refilled at his regular pharmacy on Tuesday.

## 2022-07-18 ENCOUNTER — Telehealth: Payer: Self-pay | Admitting: Cardiovascular Disease

## 2022-07-18 NOTE — Telephone Encounter (Signed)
Pt c/o medication issue:  1. Name of Medication: ticagrelor (BRILINTA) 90 MG TABS tablet   2. How are you currently taking this medication (dosage and times per day)?    3. Are you having a reaction (difficulty breathing--STAT)? no  4. What is your medication issue? Calling to see if our office have any samples. Please advise

## 2022-08-03 ENCOUNTER — Telehealth: Payer: Self-pay | Admitting: Cardiovascular Disease

## 2022-08-03 NOTE — Telephone Encounter (Signed)
Per Dr. Allyson Sabal: His pulse was low when I saw him in the office last at 44.  He is on metoprolol 25 mg p.o. twice daily.  Decrease dose to 12.5 mg p.o. twice daily.  Patient states understanding and will take 1/2 tablet in the morning and 1/2 tablet in the evening  He will keep a log of BP and HR and will call if any changes to BP that are concerning, and will let us know if this does not raise his HR.

## 2022-08-03 NOTE — Telephone Encounter (Signed)
Patient states that his pulse is in the 40's .  He is concerned as he didn't realize it was this low (had not been checking) Noticed with BP yesterday and today pulse was low.  Yesterday BP 140/70 HR 40's?  Today 142/75  HR 43.  He states "I don't feel off, just a little SOB but that has been since my surgery".  He does state some improvement with SOB as he can now work in the yard.  He feels like the SOB just "comes on sometimes". He states no other symptoms. Advised his last OV his HR was low as well, so he feels better knowing this. Advised to check his pulse a few times a day and when he has that "feeling" that he is SOB to see if pulse is fluctuating.  He will do this.  Advised I would send this to provider to review for any changes.

## 2022-08-03 NOTE — Telephone Encounter (Signed)
STAT if HR is under 50 or over 120 (normal HR is 60-100 beats per minute)  What is your heart rate? 40-43  Do you have a log of your heart rate readings (document readings)? No   Do you have any other symptoms? SOB and pain in arm pit area.

## 2022-08-16 ENCOUNTER — Telehealth: Payer: Self-pay | Admitting: Cardiovascular Disease

## 2022-08-16 NOTE — Telephone Encounter (Signed)
Pt c/o medication issue:  1. Name of Medication:   metoprolol tartrate (LOPRESSOR) 25 MG tablet    2. How are you currently taking this medication (dosage and times per day)? Take 1 tablet (25 mg total) by mouth 2 (two) times daily.   3. Are you having a reaction (difficulty breathing--STAT)? No  4. What is your medication issue? Pt called office and stated per pcp, lowering this dose didn't help. This medication is not working for pt and he'd like a callback. Please advise

## 2022-08-16 NOTE — Telephone Encounter (Signed)
Spoke to patient he stated 2 weeks ago Metoprolol Tart was decreased to 12.5 mg twice a day due to slow heart beat.Stated he is concerned heart beat still slow ranging in low 40's.He wanted to know if he can stop taking.I will send message to [facility] for advice.

## 2022-08-16 NOTE — Telephone Encounter (Signed)
Called patient left message on personal voice mail Dr.Berry advised to decrease Metoprolol Tart 25 mg 1/2 tablet once a day.Advised to call back if pulse still low.

## 2022-11-05 ENCOUNTER — Ambulatory Visit: Payer: PRIVATE HEALTH INSURANCE | Admitting: Dietician

## 2022-12-18 ENCOUNTER — Other Ambulatory Visit: Payer: Self-pay

## 2022-12-18 MED ORDER — TICAGRELOR 90 MG PO TABS: 90.0000 mg | ORAL_TABLET | Freq: Two times a day (BID) | ORAL | 1 refills | Status: DC

## 2022-12-31 ENCOUNTER — Other Ambulatory Visit: Payer: Self-pay | Admitting: Cardiovascular Disease

## 2022-12-31 ENCOUNTER — Other Ambulatory Visit: Payer: Self-pay | Admitting: Physician Assistant

## 2023-01-23 ENCOUNTER — Other Ambulatory Visit (HOSPITAL_COMMUNITY): Payer: Self-pay

## 2023-01-23 ENCOUNTER — Telehealth: Payer: Self-pay | Admitting: Pharmacy Technician

## 2023-01-23 ENCOUNTER — Telehealth: Payer: Self-pay | Admitting: Cardiovascular Disease

## 2023-01-23 DIAGNOSIS — I214 Non-ST elevation (NSTEMI) myocardial infarction: Secondary | ICD-10-CM

## 2023-01-23 NOTE — Telephone Encounter (Signed)
Pharmacy Patient Advocate Encounter   Received notification from Pt Calls Messages that prior authorization for brilinta is required/requested.   Insurance verification completed.   The patient is insured through  [place]  .   Per test claim: PA required; PA submitted to [employer] via CoverMyMeds Key/confirmation #[id number] FAXED Status is pending

## 2023-01-23 NOTE — Telephone Encounter (Signed)
Pt c/o medication issue:  1. Name of Medication: Brilinta  2. How are you currently taking this medication (dosage and times per day)?  2 times a day  3. Are you having a reaction (difficulty breathing--STAT)?   4. What is your medication issue? Patient wants to know if Dr Allyson Sabal wants him to continue taking this medicine? If so, he will need a refill and please send to prior authorization pool       *STAT* If patient is at the pharmacy, call can be transferred to refill team.   1. Which medications need to be refilled? (please list name of each medication and dose if known) will need a prior authorization for Brilinta   2. Would you like to learn more about the convenience, safety, & potential cost savings by using the [facility]?     3. Are you open to using the [facility] (Type [facility].    4. Which pharmacy/location (including street and city if local pharmacy) is medication to be sent to? [facility]   5. Do they need a 30 day or 90 day supply? 90 days # 180- i

## 2023-01-23 NOTE — Telephone Encounter (Signed)
Pt is stating that he needs a prior auth for medication brilinta. Please address

## 2023-01-25 ENCOUNTER — Other Ambulatory Visit (HOSPITAL_COMMUNITY): Payer: Self-pay

## 2023-01-25 MED ORDER — CLOPIDOGREL BISULFATE 75 MG PO TABS: 75.0000 mg | ORAL_TABLET | Freq: Every day | ORAL | 3 refills | Status: DC

## 2023-01-25 NOTE — Telephone Encounter (Signed)
Left voicemail to return call to office.

## 2023-01-25 NOTE — Telephone Encounter (Signed)
Spoke with patient and he is aware PA is still pending. He states he hs not been taking brilinta in two days. He would like to know if he still needs to be taking Brilinta being that its been over a year now.

## 2023-01-25 NOTE — Telephone Encounter (Signed)
Pt called to check on the status of the Prior Auth and is requesting a callback regarding what he should do in the mean time since he's not taking it. Please advise

## 2023-01-25 NOTE — Telephone Encounter (Signed)
Spoke with patient and he is aware to d/c brilinta and he will start plavix 75 mg daily. Plavix sent to pharmacy on file.

## 2023-01-25 NOTE — Telephone Encounter (Signed)
Patient is returning call.  °

## 2023-01-28 ENCOUNTER — Other Ambulatory Visit (HOSPITAL_COMMUNITY): Payer: Self-pay

## 2023-01-28 NOTE — Telephone Encounter (Signed)
Pharmacy Patient Advocate Encounter  Received notification from  [employer]  that Prior Authorization for brilinta has been APPROVED from 01/28/23 to 01/26/24   PA #/Case ID/Reference #: [id number]

## 2023-02-01 NOTE — Progress Notes (Addendum)
Cardiology Clinic Note   Date: 02/04/2023 ID: Benjamin Huang, DOB Feb 24, 1971, MRN [id number]  Primary Cardiologist:  Benjamin Batty, MD  Patient Profile    Benjamin Huang is a 52 y.o. male who presents to the clinic today for routine follow up.     Past medical history significant for: CAD. LHC 11/30/2021 (NSTEMI): D1 100%.  PCI with DES 3.0 x 16 mm to D1.  Mild to moderate LV dysfunction.  Mildly elevated LVEDP. Echo 11/30/2021: EF 60 to 65%.  No RWMA.  Moderate LVH.  Normal diastolic parameters.  Normal RV function.  Trivial MR.  Borderline dilatation of ascending aorta 39 mm.  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 Hypertension. Hyperlipidemia. Lipid panel 04/10/2022: LDL 43, HDL 34, TG 56, total 90. Former tobacco abuse.     History of Present Illness    Benjamin Huang was first evaluated by Dr. Allyson Huang on 11/30/2021 for NSTEMI during hospital admission.  Patient presented to [facility] ED on 11/29/2021 with complaints of upper abdominal pain that initially improved with Tums but then became persistent.  He was noted to have frequent belching throughout exam.  Troponin 95>> 665>> 2406 with peak >24,000.  Decision was made to transfer patient to [facility].  Patient continued with chest pain despite high-dose IV NTG.  He was taken to the Cath Lab emergently and underwent PCI with DES to D1.  Echo showed normal LV/RV function, LV.  Patient was last seen in the office by Dr. Allyson Huang on 04/10/2022 for routine follow-up.  He was doing well at that time and no changes were made.  Patient contacted the office on 01/25/2023 regarding needing a PA for Brilinta. Patient was instructed by Dr. Allyson Huang to stop Brilinta and start Plavix.   Discussed the use of AI scribe software for clinical note transcription with the patient, who gave verbal consent to proceed.  The patient presents for routine follow-up. Patient recently transitioned from Brilinta to Plavix. It has only been a week but he already feels  like the occasional shortness of breath he would experience over the last year on Brilinta has improved. Patient denies shortness of breath, dyspnea on exertion, lower extremity edema, orthopnea or PND. No chest pain, pressure, or tightness. No palpitations.  The patient also mentions a low heart rate, which has been a consistent issue since his heart attack. The patient has been managing his heart rate with metoprolol, but due to the low heart rate, the doctor has advised discontinuing this medication. The patient also mentions a history of smoking but quit 12 years ago. 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 The patient also reports making dietary changes to manage his health, including reducing salt and sugar intake.        ROS: All other systems reviewed and are otherwise negative except as noted in History of Present Illness.  Studies Reviewed      EKG is not ordered today.   Physical Exam    VS:  BP 136/80   Pulse (!) 39   Ht 6' (1.829 m)   Wt (!) 306 lb (138.8 kg)   SpO2 98%   BMI 41.50 kg/m  , BMI Body mass index is 41.5 kg/m.  GEN: Well nourished, well developed, in no acute distress. Neck: No JVD or carotid bruits. Cardiac:  RRR. No murmurs. No rubs or gallops.   Respiratory:  Respirations regular  and unlabored. Clear to auscultation without rales, wheezing or rhonchi. GI: Soft, nontender, nondistended. Extremities: Radials/DP/PT 2+ and equal bilaterally. No clubbing or cyanosis. No edema.  Skin: Warm and dry, no rash. Neuro: Strength intact.  Assessment & Plan      CAD Transitioned from Brilinta to Plavix without significant issues. Denies chest pain, pressure or tightness. No shortness of breath since stopping Brilinta. He is physically active achieving 10,000 steps a day. 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 He  is working on improving his eating habits for weight loss.  -Continue Plavix, aspirin, amlodipine, and rosuvastatin.  Bradycardia Heart rate in the 30s-40s. No significant symptoms except occasional lightheadedness with rapid movements and quick position changes. -Discontinue Metoprolol. -Monitor heart rate at home.  Hypertension Blood pressure slightly elevated at 136/80, possibly related to recent dietary intake. Patient had a high sodium meal yesterday.  -Encouraged to maintain low salt diet. -Continue amlodipine and HCTZ.   Hyperlipidemia On statin therapy, tolerating well. Last lipid panel was excellent. -Plan to check fasting lipids and CMP in December. -Continue rosuvastatin.   Obesity Discussed the importance of weight loss for overall health. Patient is active and making dietary changes. -Encouraged to continue efforts and consider tracking caloric intake.      Disposition: Stop metoprolol. Lipid panel and CMP in December. Return in 1 year or sooner as needed.          Signed, Benjamin Huang. Benjamin Leaton, DNP, NP-C

## 2023-02-04 ENCOUNTER — Ambulatory Visit: Payer: PRIVATE HEALTH INSURANCE | Attending: Student | Admitting: Student

## 2023-02-04 ENCOUNTER — Encounter: Payer: Self-pay | Admitting: Student

## 2023-02-04 ENCOUNTER — Other Ambulatory Visit: Payer: Self-pay | Admitting: Emergency Medicine

## 2023-02-04 VITALS — BP 136/80 | HR 39 | Ht 72.0 in | Wt 306.0 lb

## 2023-02-04 DIAGNOSIS — Z79899 Other long term (current) drug therapy: Secondary | ICD-10-CM

## 2023-02-04 DIAGNOSIS — R001 Bradycardia, unspecified: Secondary | ICD-10-CM

## 2023-02-04 DIAGNOSIS — E785 Hyperlipidemia, unspecified: Secondary | ICD-10-CM | POA: Diagnosis not present

## 2023-02-04 DIAGNOSIS — E66813 Obesity, class 3: Secondary | ICD-10-CM

## 2023-02-04 DIAGNOSIS — I2510 Atherosclerotic heart disease of native coronary artery without angina pectoris: Secondary | ICD-10-CM | POA: Diagnosis not present

## 2023-02-04 DIAGNOSIS — I10 Essential (primary) hypertension: Secondary | ICD-10-CM

## 2023-02-04 DIAGNOSIS — Z6841 Body Mass Index (BMI) 40.0 and over, adult: Secondary | ICD-10-CM

## 2023-02-04 NOTE — Patient Instructions (Addendum)
Medication Instructions:  Your physician recommends the following medication changes.  STOP TAKING: Metoprolol  *If you need a refill on your cardiac medications before your next appointment, please call your pharmacy*   Lab Work: Your provider would like for you to return in December to have the following labs drawn: Fasting Lipid panel and CMP.   Please go to [facility] [address] ([facility] [address], [place] [address] You do not need an appointment.  They are open from 7:30 am-4 pm.  Lunch from 1:00 pm- 2:00 pm You DO need to be fasting.   Follow-Up: At [facility], you and your health needs are our priority.  As part of our continuing mission to provide you with exceptional heart care, we have created designated Provider Care Teams.  These Care Teams include your primary Cardiologist (physician) and Advanced Practice Providers (APPs -  Physician Assistants and Nurse Practitioners) who all work together to provide you with the care you need, when you need it.  We recommend signing up for the patient portal called "MyChart".  Sign up information is provided on this After Visit Summary.  MyChart is used to connect with patients for Virtual Visits (Telemedicine).  Patients are able to view lab/test results, encounter notes, upcoming appointments, etc.  Non-urgent messages can be sent to your provider as well.   To learn more about what you can do with MyChart, go to [email or web address].    Your next appointment:   1 year(s)  Provider:   You may see Nanetta Batty, MD or one of the following Advanced Practice Providers on your designated Care Team:   Carlos Levering, NP

## 2023-02-11 ENCOUNTER — Other Ambulatory Visit: Payer: Self-pay | Admitting: Cardiovascular Disease

## 2023-02-15 ENCOUNTER — Other Ambulatory Visit: Payer: Self-pay | Admitting: Cardiovascular Disease

## 2023-02-22 ENCOUNTER — Telehealth: Payer: Self-pay | Admitting: Cardiovascular Disease

## 2023-02-22 NOTE — Telephone Encounter (Signed)
I called the pt to confirm what about dental procedure and that we need a request from the DDS office. Pt states [name], DMD ph# [phone]. I called the DDS office and obtained info.      Pre-operative Risk Assessment    Patient Name: Benjamin Huang  DOB: 1970-05-17 MRN: [id number]    DATE OF LAST VISIT: 02/04/23 Carlos Levering, NP DATE OF NEXT VISIT: NONE  Request for Surgical Clearance    Procedure:  Dental Extraction - Amount of Teeth to be Pulled:  4 TEETH  SURGICALLY EXTRACTED  Date of Surgery:  Clearance 02/27/23                                 Surgeon:  DR. Ross Marcus, DMD Surgeon's Group or Practice Name:   Phone number:  [phone] Fax number:  [phone]   Type of Clearance Requested:   - Medical  - Pharmacy:  Hold Clopidogrel (Plavix)     Type of Anesthesia:   IV SEDATION; VERSED AND FENTANYL   Additional requests/questions:    Elpidio Anis   02/22/2023, 11:03 AM

## 2023-02-22 NOTE — Telephone Encounter (Signed)
Pre-op team,   I do not see an official request for this. Will you please contact the patient and let him know we have not received anything from his dentist regarding this.   Thank you!  DW

## 2023-02-22 NOTE — Telephone Encounter (Signed)
Patient calling in to see when he should stop taking he Eliquis when he gets 4 teeth pull. Please advise

## 2023-02-22 NOTE — Telephone Encounter (Signed)
Please advise 

## 2023-02-22 NOTE — Telephone Encounter (Signed)
   Name: Benjamin Huang  DOB: 1970-04-30  MRN: [id number]   Primary Cardiologist: Nanetta Batty, MD  Chart reviewed as part of pre-operative protocol coverage. Benjamin Huang was last seen on 02/04/2023 by me.  He was doing well at that time with no cardiac complaints. 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   Therefore, based on ACC/AHA guidelines, the patient would be at acceptable risk for the planned procedure without further cardiovascular testing.   Per office protocol, he may hold Plavix for 5 days prior to procedure and should resume as soon as hemodynamically stable postoperatively.   I will route this recommendation to the requesting party via Epic fax function and remove from pre-op pool. Please call with questions.  Carlos Levering, NP 02/22/2023, 12:00 PM

## 2023-06-24 ENCOUNTER — Other Ambulatory Visit: Payer: Self-pay

## 2023-06-24 ENCOUNTER — Emergency Department (HOSPITAL_COMMUNITY): Payer: PRIVATE HEALTH INSURANCE

## 2023-06-24 ENCOUNTER — Encounter (HOSPITAL_COMMUNITY): Payer: Self-pay | Admitting: *Deleted

## 2023-06-24 ENCOUNTER — Emergency Department (HOSPITAL_COMMUNITY)
Admission: EM | Admit: 2023-06-24 | Discharge: 2023-06-25 | Disposition: A | Discharge status: Home / Self Care | Payer: PRIVATE HEALTH INSURANCE | Attending: Emergency Medicine | Admitting: Emergency Medicine

## 2023-06-24 DIAGNOSIS — Z7982 Long term (current) use of aspirin: Secondary | ICD-10-CM | POA: Diagnosis not present

## 2023-06-24 DIAGNOSIS — R1011 Right upper quadrant pain: Secondary | ICD-10-CM | POA: Diagnosis not present

## 2023-06-24 DIAGNOSIS — R1012 Left upper quadrant pain: Secondary | ICD-10-CM | POA: Diagnosis present

## 2023-06-24 DIAGNOSIS — Z7902 Long term (current) use of antithrombotics/antiplatelets: Secondary | ICD-10-CM | POA: Diagnosis not present

## 2023-06-24 DIAGNOSIS — R1010 Upper abdominal pain, unspecified: Secondary | ICD-10-CM

## 2023-06-24 HISTORY — DX: Acute myocardial infarction, unspecified: I21.9

## 2023-06-24 LAB — COMPREHENSIVE METABOLIC PANEL
ALT: 26 U/L (ref 0–44)
AST: 30 U/L (ref 15–41)
Albumin: 4.3 g/dL (ref 3.5–5.0)
Alkaline Phosphatase: 89 U/L (ref 38–126)
Anion gap: 9 (ref 5–15)
BUN: 11 mg/dL (ref 6–20)
CO2: 27 mmol/L (ref 22–32)
Calcium: 9.3 mg/dL (ref 8.9–10.3)
Chloride: 99 mmol/L (ref 98–111)
Creatinine, Ser: 0.81 mg/dL (ref 0.61–1.24)
GFR, Estimated: >60 mL/min (ref >60)
Glucose, Bld: 100 mg/dL — ABNORMAL HIGH (ref 70–99)
Potassium: 3.7 mmol/L (ref 3.5–5.1)
Sodium: 135 mmol/L (ref 135–145)
Total Bilirubin: 1.1 mg/dL (ref 0.0–1.2)
Total Protein: 7.9 g/dL (ref 6.5–8.1)

## 2023-06-24 LAB — TROPONIN I (HIGH SENSITIVITY)
Troponin I (High Sensitivity): 5 ng/L (ref <18)
Troponin I (High Sensitivity): 5 ng/L (ref <18)

## 2023-06-24 LAB — CBC
HCT: 45.2 % (ref 39.0–52.0)
Hemoglobin: 15.3 g/dL (ref 13.0–17.0)
MCH: 28 pg (ref 26.0–34.0)
MCHC: 33.8 g/dL (ref 30.0–36.0)
MCV: 82.8 fL (ref 80.0–100.0)
Platelets: 250 10*3/uL (ref 150–400)
RBC: 5.46 MIL/uL (ref 4.22–5.81)
RDW: 14.6 % (ref 11.5–15.5)
WBC: 7 10*3/uL (ref 4.0–10.5)
nRBC: 0 % (ref 0.0–0.2)

## 2023-06-24 LAB — LIPASE, BLOOD: Lipase: 27 U/L (ref 11–51)

## 2023-06-24 NOTE — ED Triage Notes (Signed)
 Left upper abdominal pain and some in the right upper abdominal area- pain going on for several days, but it goes away. "Feels like gas". No n/v/d or fevers. Some SOB "only when I am active". Palpitations that usually last about 20 minutes.

## 2023-06-25 MED ORDER — SIMETHICONE 125 MG PO CHEW: 125.0000 mg | CHEWABLE_TABLET | Freq: Four times a day (QID) | ORAL | 0 refills | Status: AC | PRN

## 2023-06-25 MED ORDER — PANTOPRAZOLE SODIUM 40 MG PO TBEC: 40.0000 mg | DELAYED_RELEASE_TABLET | Freq: Every day | ORAL | 3 refills | Status: AC

## 2023-06-25 NOTE — ED Provider Notes (Signed)
 [place] EMERGENCY DEPARTMENT AT [facility] Provider Note   CSN: [id number] Arrival date & time: 06/24/23  1946     History  Chief Complaint  Patient presents with   Abdominal Pain    Benjamin Huang is a 53 y.o. male.  Patient presents to the emergency department for evaluation of abdominal bloating and discomfort.  He reports that he feels like he has increased gas.  No discrete pain.  No vomiting, diarrhea.  Pain does not go up into the chest.  He has had some intermittent palpitations but this is not unusual for him.  Patient concerned because of MI in the past.  Symptoms not consistent with previous MI.       Home Medications Prior to Admission medications   Medication Sig Start Date End Date Taking? Authorizing Provider  acetaminophen (TYLENOL) 325 MG tablet Take 2 tablets (650 mg total) by mouth every 4 (four) hours as needed for headache or mild pain. 12/02/21  Yes Weaver, Scott T, PA-C  amLODipine (NORVASC) 10 MG tablet TAKE ONE TABLET BY MOUTH EVERY DAY 12/31/22  Yes Runell Gess, MD  aspirin 81 MG chewable tablet Chew 1 tablet (81 mg total) by mouth daily. 12/02/21  Yes Laverda Page B, NP  clopidogrel (PLAVIX) 75 MG tablet Take 1 tablet (75 mg total) by mouth daily. 01/25/23  Yes Runell Gess, MD  hydrochlorothiazide (HYDRODIURIL) 25 MG tablet TAKE ONE TABLET BY MOUTH EVERY DAY 02/11/23  Yes Runell Gess, MD  losartan (COZAAR) 100 MG tablet TAKE ONE TABLET BY MOUTH EVERY DAY 12/31/22  Yes Runell Gess, MD  nitroGLYCERIN (NITROSTAT) 0.4 MG SL tablet DISSOLVE 1 TABLET UNDER THE TONGUE EVERY 5 MINUTES AS NEEDED FOR CHEST PAIN. DO NOT EXCEED A TOTAL OF 3 DOSES IN 15 MINUTES. 12/31/22  Yes Runell Gess, MD  Omega-3 Fatty Acids (FISH OIL PO) Take 1 capsule by mouth daily.   Yes [provider]  potassium chloride SA (KLOR-CON M) 20 MEQ tablet TAKE ONE TABLET BY MOUTH EVERY DAY 02/15/23  Yes Runell Gess, MD  rosuvastatin (CRESTOR)  40 MG tablet TAKE ONE TABLET BY MOUTH EVERY DAY 02/15/23  Yes Runell Gess, MD      Allergies    Patient has no known allergies.    Review of Systems   Review of Systems  Physical Exam Updated Vital Signs BP (!) 149/92 (BP Location: Right Arm)   Pulse 82   Temp 98.4 F (36.9 C) (Oral)   Resp 16   Ht 6\' 1"  (1.854 m)   Wt (!) 140.6 kg   SpO2 100%   BMI 40.90 kg/m  Physical Exam Vitals and nursing note reviewed.  Constitutional:      General: He is not in acute distress.    Appearance: He is well-developed.  HENT:     Head: Normocephalic and atraumatic.     Mouth/Throat:     Mouth: Mucous membranes are moist.  Eyes:     General: Vision grossly intact. Gaze aligned appropriately.     Extraocular Movements: Extraocular movements intact.     Conjunctiva/sclera: Conjunctivae normal.  Cardiovascular:     Rate and Rhythm: Normal rate and regular rhythm.     Pulses: Normal pulses.     Heart sounds: Normal heart sounds, S1 normal and S2 normal. No murmur heard.    No friction rub. No gallop.  Pulmonary:     Effort: Pulmonary effort is normal. No respiratory distress.  Breath sounds: Normal breath sounds.  Abdominal:     Palpations: Abdomen is soft.     Tenderness: There is no abdominal tenderness. There is no guarding or rebound.     Hernia: No hernia is present.  Musculoskeletal:        General: No swelling.     Cervical back: Full passive range of motion without pain, normal range of motion and neck supple. No pain with movement, spinous process tenderness or muscular tenderness. Normal range of motion.     Right lower leg: No edema.     Left lower leg: No edema.  Skin:    General: Skin is warm and dry.     Capillary Refill: Capillary refill takes less than 2 seconds.     Findings: No ecchymosis, erythema, lesion or wound.  Neurological:     Mental Status: He is alert and oriented to person, place, and time.     GCS: GCS eye subscore is 4. GCS verbal subscore  is 5. GCS motor subscore is 6.     Cranial Nerves: Cranial nerves 2-12 are intact.     Sensory: Sensation is intact.     Motor: Motor function is intact. No weakness or abnormal muscle tone.     Coordination: Coordination is intact.  Psychiatric:        Mood and Affect: Mood normal.        Speech: Speech normal.        Behavior: Behavior normal.     ED Results / Procedures / Treatments   Labs (all labs ordered are listed, but only abnormal results are displayed) Labs Reviewed  COMPREHENSIVE METABOLIC PANEL - Abnormal; Notable for the following components:      Result Value   Glucose, Bld 100 (*)    All other components within normal limits  LIPASE, BLOOD  CBC  URINALYSIS, ROUTINE W REFLEX MICROSCOPIC  TROPONIN I (HIGH SENSITIVITY)  TROPONIN I (HIGH SENSITIVITY)    EKG EKG Interpretation Date/Time:  Monday June 24 2023 19:54:47 EST Ventricular Rate:  85 PR Interval:  174 QRS Duration:  118 QT Interval:  402 QTC Calculation: 478 R Axis:   -53  Text Interpretation: Sinus rhythm with frequent Premature ventricular complexes and Fusion complexes Possible Left atrial enlargement Pulmonary disease pattern Incomplete right bundle branch block Left anterior fascicular block Abnormal ECG When compared with ECG of 01-Dec-2021 06:53, Fusion complexes are now Present Premature ventricular complexes are now Present Nonspecific T wave abnormality no longer evident in Lateral leads Confirmed by Beckey Downing [phone]) on 06/24/2023 8:22:08 PM  Radiology No results found.  Procedures Procedures    Medications Ordered in ED Medications - No data to display  ED Course/ Medical Decision Making/ A&P                                 Medical Decision Making Amount and/or Complexity of Data Reviewed Labs: ordered. Radiology: ordered.   Differential Diagnosis considered includes, but not limited to: Cholelithiasis; cholecystitis; cholangitis; bowel obstruction; esophagitis; gastritis;  peptic ulcer disease; pancreatitis; cardiac.   Lab work reassuring.  This includes a negative cardiac workup.  LFTs, lipase normal.  No leukocytosis.  Abdominal exam benign, nontender.        Final Clinical Impression(s) / ED Diagnoses Final diagnoses:  Pain of upper abdomen    Rx / DC Orders ED Discharge Orders     None  Gilda Crease, MD 06/25/23 [phone]

## 2023-11-11 ENCOUNTER — Other Ambulatory Visit: Payer: Self-pay

## 2023-11-11 ENCOUNTER — Encounter (HOSPITAL_COMMUNITY): Payer: Self-pay | Admitting: Emergency Medicine

## 2023-11-11 ENCOUNTER — Emergency Department (HOSPITAL_COMMUNITY): Payer: PRIVATE HEALTH INSURANCE

## 2023-11-11 ENCOUNTER — Emergency Department (HOSPITAL_COMMUNITY)
Admission: EM | Admit: 2023-11-11 | Discharge: 2023-11-11 | Disposition: A | Discharge status: Home / Self Care | Payer: PRIVATE HEALTH INSURANCE | Source: Other Acute Inpatient Hospital | Attending: Emergency Medicine | Admitting: Emergency Medicine

## 2023-11-11 DIAGNOSIS — Z79899 Other long term (current) drug therapy: Secondary | ICD-10-CM | POA: Diagnosis not present

## 2023-11-11 DIAGNOSIS — I10 Essential (primary) hypertension: Secondary | ICD-10-CM | POA: Diagnosis not present

## 2023-11-11 DIAGNOSIS — Z7982 Long term (current) use of aspirin: Secondary | ICD-10-CM | POA: Diagnosis not present

## 2023-11-11 DIAGNOSIS — R0789 Other chest pain: Secondary | ICD-10-CM | POA: Diagnosis present

## 2023-11-11 DIAGNOSIS — Z7901 Long term (current) use of anticoagulants: Secondary | ICD-10-CM | POA: Insufficient documentation

## 2023-11-11 DIAGNOSIS — R079 Chest pain, unspecified: Secondary | ICD-10-CM

## 2023-11-11 LAB — CBC
HCT: 45.3 % (ref 39.0–52.0)
Hemoglobin: 15.6 g/dL (ref 13.0–17.0)
MCH: 28.6 pg (ref 26.0–34.0)
MCHC: 34.4 g/dL (ref 30.0–36.0)
MCV: 83.1 fL (ref 80.0–100.0)
Platelets: 250 K/uL (ref 150–400)
RBC: 5.45 MIL/uL (ref 4.22–5.81)
RDW: 14.4 % (ref 11.5–15.5)
WBC: 6.9 K/uL (ref 4.0–10.5)
nRBC: 0 % (ref 0.0–0.2)

## 2023-11-11 LAB — BASIC METABOLIC PANEL WITH GFR
Anion gap: 13 (ref 5–15)
BUN: 12 mg/dL (ref 6–20)
CO2: 25 mmol/L (ref 22–32)
Calcium: 9 mg/dL (ref 8.9–10.3)
Chloride: 97 mmol/L — ABNORMAL LOW (ref 98–111)
Creatinine, Ser: 1.08 mg/dL (ref 0.61–1.24)
GFR, Estimated: >60 mL/min (ref >60)
Glucose, Bld: 104 mg/dL — ABNORMAL HIGH (ref 70–99)
Potassium: 3.8 mmol/L (ref 3.5–5.1)
Sodium: 135 mmol/L (ref 135–145)

## 2023-11-11 LAB — TROPONIN I (HIGH SENSITIVITY)
Troponin I (High Sensitivity): 4 ng/L (ref <18)
Troponin I (High Sensitivity): 5 ng/L (ref <18)

## 2023-11-11 NOTE — ED Triage Notes (Signed)
 Pt to the ED POV from [facility] with complaints of chest pain that began last night. The pain has been intermittent and non-radiating.

## 2023-11-11 NOTE — ED Provider Notes (Signed)
 Benjamin Huang EMERGENCY DEPARTMENT AT [facility] Provider Note   CSN: [id number] Arrival date & time: 11/11/23  1321     Patient presents with: Chest Pain   Benjamin Huang is a 53 y.o. male.    Chest Pain Patient presents with chest pain.  Anterior chest.  Began last night.  In the lower chest area.  Does not feel like previous MI.  States he was able to walk today without difficulty.  Did develop some pain later that was not exertional.  No fevers.  Previous MI.      Past Medical History:  Diagnosis Date   HTN (hypertension)    Hyperlipidemia LDL goal <70 12/02/2021   Myocardial infarction (HCC)    Obesity, morbid, BMI 40.0-49.9 (HCC)     Prior to Admission medications   Medication Sig Start Date End Date Taking? Authorizing Provider  acetaminophen  (TYLENOL ) 325 MG tablet Take 2 tablets (650 mg total) by mouth every 4 (four) hours as needed for headache or mild pain. 12/02/21   Lelon Hamilton T, PA-C  amLODipine  (NORVASC ) 10 MG tablet TAKE ONE TABLET BY MOUTH EVERY DAY 12/31/22   Berry, Jonathan J, MD  aspirin  81 MG chewable tablet Chew 1 tablet (81 mg total) by mouth daily. 12/02/21   Henry Manuelita NOVAK, NP  clopidogrel  (PLAVIX ) 75 MG tablet Take 1 tablet (75 mg total) by mouth daily. 01/25/23   Court Dorn PARAS, MD  hydrochlorothiazide  (HYDRODIURIL ) 25 MG tablet TAKE ONE TABLET BY MOUTH EVERY DAY 02/11/23   Court Dorn PARAS, MD  losartan  (COZAAR ) 100 MG tablet TAKE ONE TABLET BY MOUTH EVERY DAY 12/31/22   Court Dorn PARAS, MD  nitroGLYCERIN  (NITROSTAT ) 0.4 MG SL tablet DISSOLVE 1 TABLET UNDER THE TONGUE EVERY 5 MINUTES AS NEEDED FOR CHEST PAIN. DO NOT EXCEED A TOTAL OF 3 DOSES IN 15 MINUTES. 12/31/22   Court Dorn PARAS, MD  Omega-3 Fatty Acids (FISH OIL PO) Take 1 capsule by mouth daily.    [provider]  pantoprazole  (PROTONIX ) 40 MG tablet Take 1 tablet (40 mg total) by mouth daily. 06/25/23   Haze Lonni PARAS, MD  potassium chloride  SA (KLOR-CON  M) 20  MEQ tablet TAKE ONE TABLET BY MOUTH EVERY DAY 02/15/23   Court Dorn PARAS, MD  rosuvastatin  (CRESTOR ) 40 MG tablet TAKE ONE TABLET BY MOUTH EVERY DAY 02/15/23   Court Dorn PARAS, MD  simethicone  (MYLICON) 125 MG chewable tablet Chew 1 tablet (125 mg total) by mouth every 6 (six) hours as needed for flatulence (or bloating). 06/25/23   Haze Lonni PARAS, MD    Allergies: Patient has no known allergies.    Review of Systems  Cardiovascular:  Positive for chest pain.    Updated Vital Signs BP (!) 133/97   Pulse (!) 59   Temp 98.7 F (37.1 C) (Oral)   Resp 14   Ht 6' (1.829 m)   Wt (!) 140.6 kg   SpO2 96%   BMI 42.04 kg/m   Physical Exam Vitals and nursing note reviewed.  Cardiovascular:     Rate and Rhythm: Normal rate and regular rhythm.  Chest:     Chest wall: No tenderness.  Musculoskeletal:     Right lower leg: No edema.     Left lower leg: No edema.  Neurological:     Mental Status: He is alert.     (all labs ordered are listed, but only abnormal results are displayed) Labs Reviewed  BASIC METABOLIC PANEL WITH GFR -  Abnormal; Notable for the following components:      Result Value   Chloride 97 (*)    Glucose, Bld 104 (*)    All other components within normal limits  CBC  TROPONIN I (HIGH SENSITIVITY)  TROPONIN I (HIGH SENSITIVITY)    EKG: EKG Interpretation Date/Time:  Monday November 11 2023 13:32:47 EDT Ventricular Rate:  90 PR Interval:  170 QRS Duration:  122 QT Interval:  402 QTC Calculation: 491 R Axis:   129  Text Interpretation: Sinus rhythm with frequent atrial-paced complexes and with frequent Premature ventricular complexes and Premature atrial complexes Right bundle branch block Abnormal ECG When compared with ECG of 24-Jun-2023 19:54, PVCs more frequent Confirmed by Patsey Lot [phone]) on 11/11/2023 1:51:44 PM  Radiology: [facility] Chest 2 View Result Date: 11/11/2023 CLINICAL DATA:  Chest pain EXAM: CHEST - 2 VIEW COMPARISON:  06/24/2023  FINDINGS: The heart size and mediastinal contours are within normal limits. Both lungs are clear. The visualized skeletal structures are unremarkable. IMPRESSION: No active cardiopulmonary disease. Electronically Signed   By: Rockey Kilts M.D.   On: 11/11/2023 14:26     Procedures   Medications Ordered in the ED - No data to display                                  Medical Decision Making Amount and/or Complexity of Data Reviewed Labs: ordered. Radiology: ordered.   Patient with chest pain.  Anterior chest.  Has not been exertional but has previous cardiac history.  Differential diagnose includes nonspecific cause of chest pain but also causes such as coronary disease, ACS.  EKG reassuring.  Did have frequent PVCs that had actually cleared up some.  Reviewing records appears that these are somewhat chronic for him.  Troponin negative x 2.  X-ray reassuring.  Appears stable for discharge home.  Follow-up with cardiology.      Final diagnoses:  Nonspecific chest pain    ED Discharge Orders          Ordered    Ambulatory referral to Cardiology       Comments: If you have not heard from the Cardiology office within the next 72 hours please call [phone].   11/11/23 1716               Patsey Lot, MD 11/11/23 1749

## 2023-12-24 ENCOUNTER — Other Ambulatory Visit: Payer: Self-pay | Admitting: Cardiovascular Disease

## 2023-12-29 ENCOUNTER — Other Ambulatory Visit: Payer: Self-pay | Admitting: Cardiovascular Disease

## 2023-12-29 DIAGNOSIS — I214 Non-ST elevation (NSTEMI) myocardial infarction: Secondary | ICD-10-CM

## 2023-12-31 ENCOUNTER — Encounter: Payer: Self-pay | Admitting: Cardiology

## 2023-12-31 ENCOUNTER — Ambulatory Visit: Payer: PRIVATE HEALTH INSURANCE | Attending: Cardiology | Admitting: Cardiology

## 2023-12-31 VITALS — BP 130/82 | HR 89 | Ht 72.0 in | Wt 313.2 lb

## 2023-12-31 DIAGNOSIS — I2510 Atherosclerotic heart disease of native coronary artery without angina pectoris: Secondary | ICD-10-CM | POA: Diagnosis not present

## 2023-12-31 DIAGNOSIS — I10 Essential (primary) hypertension: Secondary | ICD-10-CM

## 2023-12-31 NOTE — Patient Instructions (Signed)
 Medication Instructions:  Your physician recommends that you continue on your current medications as directed. Please refer to the Current Medication list given to you today.   *If you need a refill on your cardiac medications before your next appointment, please call your pharmacy*  Lab Work: No labs ordered today  If you have labs (blood work) drawn today and your tests are completely normal, you will receive your results only by: MyChart Message (if you have MyChart) OR A paper copy in the mail If you have any lab test that is abnormal or we need to change your treatment, we will call you to review the results.  Testing/Procedures: No test ordered today   Follow-Up: At [facility], you and your health needs are our priority.  As part of our continuing mission to provide you with exceptional heart care, our providers are all part of one team.  This team includes your primary Cardiologist (physician) and Advanced Practice Providers or APPs (Physician Assistants and Nurse Practitioners) who all work together to provide you with the care you need, when you need it.  Your next appointment:   1 year(s)  Provider:   You may see Dr. Darliss or one of the following Advanced Practice Providers on your designated Care Team:   Lonni Meager, NP Lesley Maffucci, PA-C Bernardino Bring, PA-C Cadence Le Roy, PA-C Tylene Lunch, NP Barnie Hila, NP    We recommend signing up for the patient portal called MyChart.  Sign up information is provided on this After Visit Summary.  MyChart is used to connect with patients for Virtual Visits (Telemedicine).  Patients are able to view lab/test results, encounter notes, upcoming appointments, etc.  Non-urgent messages can be sent to your provider as well.   To learn more about what you can do with MyChart, go to [email or web address].

## 2023-12-31 NOTE — Progress Notes (Signed)
 Cardiology Office Note:    Date:  12/31/2023   ID:  Benjamin Huang, DOB 11/28/1970, MRN [id number]  PCP:  [name], MD   [facility] Providers Cardiologist:  Redell Cave, MD     Referring MD: Patsey Lot, MD   Chief Complaint  Patient presents with   Follow-up    2 month follow up pt has been doing well with no complaints of chest pain, chest pressure or SOB, medciation reviewed verbally with patient    History of Present Illness:    Benjamin Huang is a 53 y.o. male with a hx of CAD/NSTEMI s/p DES to prox D1 in 2023, hypertension, former smoker x 20 years presenting for follow-up.  Doing okay, denies chest pain or shortness of breath.  Compliant to medications as prescribed.  Endorses abdominal bloating and feeling gassy last week which improved after taking simethicone .  He is a high Programmer, multimedia.  Working on increasing his activity levels.   Prior notes/testing Echo 8/23 EF 60 to 65%.  Past Medical History:  Diagnosis Date   HTN (hypertension)    Hyperlipidemia LDL goal <70 12/02/2021   Myocardial infarction (HCC)    Obesity, morbid, BMI 40.0-49.9 (HCC)     Past Surgical History:  Procedure Laterality Date   CORONARY ANGIOPLASTY WITH STENT PLACEMENT     CORONARY STENT INTERVENTION N/A 11/30/2021   Procedure: CORONARY STENT INTERVENTION;  Surgeon: Court Dorn PARAS, MD;  Location: [facility];  Service: Cardiovascular;  Laterality: N/A;   CORONARY/GRAFT ACUTE MI REVASCULARIZATION N/A 11/30/2021   Procedure: Coronary/Graft Acute MI Revascularization;  Surgeon: Court Dorn PARAS, MD;  Location: [facility];  Service: Cardiovascular;  Laterality: N/A;   LEFT HEART CATH AND CORONARY ANGIOGRAPHY N/A 11/30/2021   Procedure: LEFT HEART CATH AND CORONARY ANGIOGRAPHY;  Surgeon: Court Dorn PARAS, MD;  Location: [facility];  Service: Cardiovascular;  Laterality: N/A;    Current Medications: Current Meds  Medication  Sig   acetaminophen  (TYLENOL ) 325 MG tablet Take 2 tablets (650 mg total) by mouth every 4 (four) hours as needed for headache or mild pain.   amLODipine  (NORVASC ) 10 MG tablet TAKE ONE TABLET BY MOUTH EVERY DAY   aspirin  81 MG chewable tablet Chew 1 tablet (81 mg total) by mouth daily.   clopidogrel  (PLAVIX ) 75 MG tablet Take 1 tablet (75 mg total) by mouth daily.   hydrochlorothiazide  (HYDRODIURIL ) 25 MG tablet TAKE ONE TABLET BY MOUTH EVERY DAY   losartan  (COZAAR ) 100 MG tablet TAKE ONE TABLET BY MOUTH EVERY DAY   nitroGLYCERIN  (NITROSTAT ) 0.4 MG SL tablet DISSOLVE 1 TABLET UNDER THE TONGUE EVERY 5 MINUTES AS NEEDED FOR CHEST PAIN. DO NOT EXCEED A TOTAL OF 3 DOSES IN 15 MINUTES.   Omega-3 Fatty Acids (FISH OIL PO) Take 1 capsule by mouth daily.   pantoprazole  (PROTONIX ) 40 MG tablet Take 1 tablet (40 mg total) by mouth daily.   potassium chloride  SA (KLOR-CON  M) 20 MEQ tablet TAKE ONE TABLET BY MOUTH EVERY DAY   rosuvastatin  (CRESTOR ) 40 MG tablet TAKE ONE TABLET BY MOUTH EVERY DAY   simethicone  (MYLICON) 125 MG chewable tablet Chew 1 tablet (125 mg total) by mouth every 6 (six) hours as needed for flatulence (or bloating).     Allergies:   Patient has no known allergies.   Social History   Socioeconomic History   Marital status: Single    Spouse name: Not on file   Number of children:  Not on file   Years of education: Not on file   Highest education level: Not on file  Occupational History   Not on file  Tobacco Use   Smoking status: Some Days    Types: Cigars   Smokeless tobacco: Never   Tobacco comments:    cigar every now and then  Substance and Sexual Activity   Alcohol use: Yes    Comment: occas   Drug use: No   Sexual activity: Not on file  Other Topics Concern   Not on file  Social History Narrative   Not on file   Social Drivers of Health   Financial Resource Strain: Not on file  Food Insecurity: Not on file  Transportation Needs: Not on file  Physical  Activity: Not on file  Stress: Not on file  Social Connections: Not on file     Family History: The patient's family history is negative for Colon cancer and Liver disease.  ROS:   Please see the history of present illness.     All other systems reviewed and are negative.  EKGs/Labs/Other Studies Reviewed:    The following studies were reviewed today:       Recent Labs: 06/24/2023: ALT 26 11/11/2023: BUN 12; Creatinine, Ser 1.08; Hemoglobin 15.6; Platelets 250; Potassium 3.8; Sodium 135  Recent Lipid Panel    Component Value Date/Time   CHOL 90 (L) 04/10/2022 1035   TRIG 56 04/10/2022 1035   HDL 34 (L) 04/10/2022 1035   CHOLHDL 2.6 04/10/2022 1035   CHOLHDL 5.1 11/30/2021 0302   VLDL 15 11/30/2021 0302   LDLCALC 43 04/10/2022 1035     Risk Assessment/Calculations:              Physical Exam:    VS:  BP 130/82 (BP Location: Left Arm, Patient Position: Sitting, Cuff Size: Large)   Pulse 89   Ht 6' (1.829 m)   Wt (!) 313 lb 3.2 oz (142.1 kg)   SpO2 97%   BMI 42.48 kg/m     Wt Readings from Last 3 Encounters:  12/31/23 (!) 313 lb 3.2 oz (142.1 kg)  11/11/23 (!) 310 lb (140.6 kg)  06/24/23 (!) 310 lb (140.6 kg)     GEN:  Well nourished, well developed in no acute distress HEENT: Normal NECK: No JVD; No carotid bruits CARDIAC: RRR, no murmurs, rubs, gallops RESPIRATORY:  Clear to auscultation without rales, wheezing or rhonchi  ABDOMEN: Soft, non-tender, non-distended MUSCULOSKELETAL:  No edema; No deformity  SKIN: Warm and dry NEUROLOGIC:  Alert and oriented x 3 PSYCHIATRIC:  Normal affect   ASSESSMENT:    1. Coronary artery disease involving native coronary artery of native heart, unspecified whether angina present   2. Primary hypertension   3. Morbid obesity (HCC)    PLAN:    In order of problems listed above:  NSTEMI/CAD s/p DES to proximal D1 in 2023.  Denies chest pain.  Continue aspirin , Plavix , Crestor  40 mg daily.  LDL at  goal. Hypertension, BP controlled.  Continue Norvasc  10 mg daily, losartan  100 mg daily. Morbid obesity, low-calorie diet, increased activity, weight loss advised.  Follow-up in 1 year.      Medication Adjustments/Labs and Tests Ordered: Current medicines are reviewed at length with the patient today.  Concerns regarding medicines are outlined above.  No orders of the defined types were placed in this encounter.  No orders of the defined types were placed in this encounter.   Patient Instructions  Medication Instructions:  Your physician recommends that you continue on your current medications as directed. Please refer to the Current Medication list given to you today.   *If you need a refill on your cardiac medications before your next appointment, please call your pharmacy*  Lab Work: No labs ordered today  If you have labs (blood work) drawn today and your tests are completely normal, you will receive your results only by: MyChart Message (if you have MyChart) OR A paper copy in the mail If you have any lab test that is abnormal or we need to change your treatment, we will call you to review the results.  Testing/Procedures: No test ordered today   Follow-Up: At [facility], you and your health needs are our priority.  As part of our continuing mission to provide you with exceptional heart care, our providers are all part of one team.  This team includes your primary Cardiologist (physician) and Advanced Practice Providers or APPs (Physician Assistants and Nurse Practitioners) who all work together to provide you with the care you need, when you need it.  Your next appointment:   1 year(s)  Provider:   You may see Dr. Darliss or one of the following Advanced Practice Providers on your designated Care Team:   Lonni Meager, NP Lesley Maffucci, PA-C Bernardino Bring, PA-C [facility], PA-C Tylene Lunch, NP Barnie Hila, NP    We recommend signing up for  the patient portal called MyChart.  Sign up information is provided on this After Visit Summary.  MyChart is used to connect with patients for Virtual Visits (Telemedicine).  Patients are able to view lab/test results, encounter notes, upcoming appointments, etc.  Non-urgent messages can be sent to your provider as well.   To learn more about what you can do with MyChart, go to [email or web address].         Signed, Redell Darliss, MD  12/31/2023 9:26 AM    [facility]

## 2024-01-01 ENCOUNTER — Other Ambulatory Visit: Payer: Self-pay

## 2024-01-01 ENCOUNTER — Emergency Department (HOSPITAL_COMMUNITY): Payer: PRIVATE HEALTH INSURANCE

## 2024-01-01 ENCOUNTER — Encounter (HOSPITAL_COMMUNITY): Payer: Self-pay

## 2024-01-01 ENCOUNTER — Emergency Department (HOSPITAL_COMMUNITY)
Admission: EM | Admit: 2024-01-01 | Discharge: 2024-01-02 | Disposition: A | Discharge status: Home / Self Care | Payer: PRIVATE HEALTH INSURANCE | Attending: Emergency Medicine | Admitting: Emergency Medicine

## 2024-01-01 DIAGNOSIS — Z7982 Long term (current) use of aspirin: Secondary | ICD-10-CM | POA: Diagnosis not present

## 2024-01-01 DIAGNOSIS — F17290 Nicotine dependence, other tobacco product, uncomplicated: Secondary | ICD-10-CM | POA: Diagnosis not present

## 2024-01-01 DIAGNOSIS — I10 Essential (primary) hypertension: Secondary | ICD-10-CM | POA: Diagnosis not present

## 2024-01-01 DIAGNOSIS — M549 Dorsalgia, unspecified: Secondary | ICD-10-CM | POA: Diagnosis not present

## 2024-01-01 DIAGNOSIS — I2510 Atherosclerotic heart disease of native coronary artery without angina pectoris: Secondary | ICD-10-CM | POA: Insufficient documentation

## 2024-01-01 DIAGNOSIS — Z79899 Other long term (current) drug therapy: Secondary | ICD-10-CM | POA: Diagnosis not present

## 2024-01-01 DIAGNOSIS — R079 Chest pain, unspecified: Secondary | ICD-10-CM | POA: Insufficient documentation

## 2024-01-01 DIAGNOSIS — N3289 Other specified disorders of bladder: Secondary | ICD-10-CM | POA: Diagnosis not present

## 2024-01-01 LAB — BASIC METABOLIC PANEL WITH GFR
Anion gap: 14 (ref 5–15)
BUN: 12 mg/dL (ref 6–20)
CO2: 26 mmol/L (ref 22–32)
Calcium: 9.3 mg/dL (ref 8.9–10.3)
Chloride: 98 mmol/L (ref 98–111)
Creatinine, Ser: 0.97 mg/dL (ref 0.61–1.24)
GFR, Estimated: >60 mL/min (ref >60)
Glucose, Bld: 92 mg/dL (ref 70–99)
Potassium: 3.5 mmol/L (ref 3.5–5.1)
Sodium: 138 mmol/L (ref 135–145)

## 2024-01-01 LAB — CBC
HCT: 42.8 % (ref 39.0–52.0)
Hemoglobin: 14.6 g/dL (ref 13.0–17.0)
MCH: 28.3 pg (ref 26.0–34.0)
MCHC: 34.1 g/dL (ref 30.0–36.0)
MCV: 82.9 fL (ref 80.0–100.0)
Platelets: 224 K/uL (ref 150–400)
RBC: 5.16 MIL/uL (ref 4.22–5.81)
RDW: 14 % (ref 11.5–15.5)
WBC: 8.3 K/uL (ref 4.0–10.5)
nRBC: 0 % (ref 0.0–0.2)

## 2024-01-01 LAB — TROPONIN I (HIGH SENSITIVITY): Troponin I (High Sensitivity): 5 ng/L (ref <18)

## 2024-01-01 NOTE — ED Triage Notes (Signed)
 Patient from home for chest tightness that worsened today. Reports history of STEMI with stent placement 01/2022. States this feels similar to previous heart attack. Denies N/V, SOB, cough, or fevers. Upon arrival to ER, patient is alert and oriented, ambu

## 2024-01-02 ENCOUNTER — Emergency Department (HOSPITAL_COMMUNITY): Payer: PRIVATE HEALTH INSURANCE

## 2024-01-02 LAB — TROPONIN I (HIGH SENSITIVITY): Troponin I (High Sensitivity): 5 ng/L (ref <18)

## 2024-01-02 MED ORDER — IOHEXOL 350 MG/ML SOLN
100.0000 mL | Freq: Once | INTRAVENOUS | Status: AC | PRN
  Administered 2024-01-02: 100 mL via INTRAVENOUS

## 2024-01-02 NOTE — ED Provider Notes (Signed)
 Emergency Department Provider Note  TRIAGE NOTE: Patient from home for chest tightness that worsened today. Reports history of STEMI with stent placement 01/2022. States this feels similar to previous heart attack. Denies N/V, SOB, cough, or fevers. Upon arrival to ER, patient is alert and oriented, ambu  HISTORY  Chief Complaint Chest Pain   HPI Benjamin Huang is a 53 y.o. male with  chest discomfort and upper back pain, reminiscent of symptoms experienced during a previous myocardial infarction approximately two years ago. The patient has a history of coronary artery disease with one stent placement. The current episode began during routine activities and is not associated with any strenuous exertion. The patient reports a sensation similar to gas, which was initially relieved with simethicone , but the discomfort has persisted today. There is no associated nausea, recent illness, cough, fever, or new leg swelling. The patient wears compression socks to manage occasional leg swelling due to prolonged standing. The patient denies any recent changes in health status or new symptoms. History was obtained from the patient.  PMH Past Medical History:  Diagnosis Date   HTN (hypertension)    Hyperlipidemia LDL goal <70 12/02/2021   Myocardial infarction (HCC)    Obesity, morbid, BMI 40.0-49.9 (HCC)     Home Medications Prior to Admission medications   Medication Sig Start Date End Date Taking? Authorizing Provider  acetaminophen  (TYLENOL ) 325 MG tablet Take 2 tablets (650 mg total) by mouth every 4 (four) hours as needed for headache or mild pain. 12/02/21   Lelon Hamilton T, PA-C  amLODipine  (NORVASC ) 10 MG tablet TAKE ONE TABLET BY MOUTH EVERY DAY 12/31/22   Court Dorn PARAS, MD  aspirin  81 MG chewable tablet Chew 1 tablet (81 mg total) by mouth daily. 12/02/21   Henry Manuelita NOVAK, NP  clopidogrel  (PLAVIX ) 75 MG tablet Take 1 tablet (75 mg total) by mouth daily. 01/25/23   Court Dorn PARAS, MD  hydrochlorothiazide  (HYDRODIURIL ) 25 MG tablet TAKE ONE TABLET BY MOUTH EVERY DAY 02/11/23   Court Dorn PARAS, MD  losartan  (COZAAR ) 100 MG tablet TAKE ONE TABLET BY MOUTH EVERY DAY 12/24/23   Court Dorn PARAS, MD  nitroGLYCERIN  (NITROSTAT ) 0.4 MG SL tablet DISSOLVE 1 TABLET UNDER THE TONGUE EVERY 5 MINUTES AS NEEDED FOR CHEST PAIN. DO NOT EXCEED A TOTAL OF 3 DOSES IN 15 MINUTES. 12/31/22   Court Dorn PARAS, MD  Omega-3 Fatty Acids (FISH OIL PO) Take 1 capsule by mouth daily.    [provider]  pantoprazole  (PROTONIX ) 40 MG tablet Take 1 tablet (40 mg total) by mouth daily. 06/25/23   Pollina, Christopher J, MD  potassium chloride  SA (KLOR-CON  M) 20 MEQ tablet TAKE ONE TABLET BY MOUTH EVERY DAY 02/15/23   Court Dorn PARAS, MD  rosuvastatin  (CRESTOR ) 40 MG tablet TAKE ONE TABLET BY MOUTH EVERY DAY 02/15/23   Court Dorn PARAS, MD  simethicone  (MYLICON) 125 MG chewable tablet Chew 1 tablet (125 mg total) by mouth every 6 (six) hours as needed for flatulence (or bloating). 06/25/23   Haze Lonni PARAS, MD    Social History Social History   Tobacco Use   Smoking status: Some Days    Types: Cigars   Smokeless tobacco: Never   Tobacco comments:    cigar every now and then  Substance Use Topics   Alcohol use: Yes    Comment: occas   Drug use: No    Review of Systems: Documented in HPI ____________________________________________  PHYSICAL EXAM: VITAL SIGNS:  Triage: Blood pressure (!) 137/90, pulse 73, temperature 98.2 F (36.8 C), resp. rate 18, height 6' (1.829 m), weight (!) 140.6 kg, SpO2 96%.  Vitals:   01/02/24 0106 01/02/24 0115 01/02/24 0130 01/02/24 0310  BP:  (!) 154/89 (!) 154/77 (!) 137/90  Pulse:  67 (!) 59 73  Resp:  15 18 18   Temp:    98.2 F (36.8 C)  TempSrc:      SpO2: 99% 99% 97% 96%  Weight:      Height:        Physical Exam Vitals and nursing note reviewed.  Constitutional:      Appearance: He is well-developed.  HENT:     Head:  Normocephalic and atraumatic.  Cardiovascular:     Rate and Rhythm: Normal rate.     Pulses:          Radial pulses are 2+ on the right side and 2+ on the left side.  Pulmonary:     Effort: Pulmonary effort is normal. No respiratory distress.  Abdominal:     General: There is no distension.  Musculoskeletal:        General: Normal range of motion.     Cervical back: Normal range of motion.  Neurological:     Mental Status: He is alert.       ____________________________________________   LABS (all labs ordered are listed, but only abnormal results are displayed)  Labs Reviewed  BASIC METABOLIC PANEL WITH GFR  CBC  TROPONIN I (HIGH SENSITIVITY)  TROPONIN I (HIGH SENSITIVITY)   ____________________________________________  EKG   EKG Interpretation Date/Time:  Wednesday January 01 2024 22:15:52 EDT Ventricular Rate:  80 PR Interval:  170 QRS Duration:  122 QT Interval:  396 QTC Calculation: 456 R Axis:   -48  Text Interpretation: Sinus rhythm with occasional Premature ventricular complexes Left anterior fascicular block Nonspecific T wave abnormality Abnormal ECG When compared with ECG of 11-Nov-2023 13:32, Sinus rhythm has replaced Electronic atrial pacemaker Left anterior fascicular block is now Present Confirmed by Lorette Mayo [phone]) on 01/02/2024 12:29:19 AM        ____________________________________________  RADIOLOGY  CT Angio Chest/Abd/Pel for Dissection W and/or Wo Contrast Result Date: 01/02/2024 CLINICAL DATA:  Acute aortic syndrome (AAS) suspected. Chest tightness EXAM: CT ANGIOGRAPHY CHEST, ABDOMEN AND PELVIS TECHNIQUE: 01/24/2015 Multidetector CT imaging through the chest, abdomen and pelvis was performed using the standard protocol during bolus administration of intravenous contrast. Multiplanar reconstructed images and MIPs were obtained and reviewed to evaluate the vascular anatomy. RADIATION DOSE REDUCTION: This exam was performed according to  the departmental dose-optimization program which includes automated exposure control, adjustment of the mA and/or kV according to patient size and/or use of iterative reconstruction technique. CONTRAST:  OMNIPAQUE  IOHEXOL  350 MG/ML SOLN COMPARISON:  01/24/2015 FINDINGS: CTA CHEST FINDINGS Cardiovascular: Heart is normal size. Aorta is normal caliber. Scattered coronary artery and aortic atherosclerosis. No filling defects in the pulmonary arteries to suggest pulmonary emboli. Mediastinum/Nodes: No mediastinal, hilar, or axillary adenopathy. Trachea and esophagus are unremarkable. Thyroid unremarkable. Lungs/Pleura: Lungs are clear. No focal airspace opacities or suspicious nodules. No effusions. Musculoskeletal: Chest wall soft tissues are unremarkable. No acute bony abnormality. Review of the MIP images confirms the above findings. CTA ABDOMEN AND PELVIS FINDINGS VASCULAR Aorta: Normal caliber aorta without aneurysm, dissection, vasculitis or significant stenosis. Celiac: Patent without evidence of aneurysm, dissection, vasculitis or significant stenosis. SMA: Patent without evidence of aneurysm, dissection, vasculitis or significant stenosis. Renals: Both renal arteries are  patent without evidence of aneurysm, dissection, vasculitis, fibromuscular dysplasia or significant stenosis. IMA: Patent without evidence of aneurysm, dissection, vasculitis or significant stenosis. Inflow: Patent without evidence of aneurysm, dissection, vasculitis or significant stenosis. Veins: No obvious venous abnormality within the limitations of this arterial phase study. Review of the MIP images confirms the above findings. NON-VASCULAR Hepatobiliary: No focal hepatic abnormality. Gallbladder unremarkable. Pancreas: No focal abnormality or ductal dilatation. Spleen: No focal abnormality.  Normal size. Adrenals/Urinary Tract: No adrenal abnormality. No focal renal abnormality. No stones or hydronephrosis. Urinary bladder is  unremarkable. Stomach/Bowel: Stomach is within normal limits. Appendix appears normal. No evidence of bowel wall thickening, distention, or inflammatory changes. Lymphatic: No adenopathy Reproductive: No visible focal abnormality. Other: No free fluid or free air. Musculoskeletal: No acute bony abnormality. Review of the MIP images confirms the above findings. IMPRESSION: No evidence of aortic aneurysm or dissection. No evidence of pulmonary embolus. No acute findings in the chest, abdomen or pelvis. Electronically Signed   By: Franky Crease M.D.   On: 01/02/2024 01:55   DG Chest 2 View Result Date: 01/01/2024 CLINICAL DATA:  Chest tightness EXAM: CHEST - 2 VIEW COMPARISON:  11/11/2023 FINDINGS: The heart size and mediastinal contours are within normal limits. Both lungs are clear. The visualized skeletal structures are unremarkable. IMPRESSION: No active cardiopulmonary disease. Electronically Signed   By: Luke Bun M.D.   On: 01/01/2024 22:43   ____________________________________________  PROCEDURES  Procedure(s) performed:   Procedures ____________________________________________  INITIAL IMPRESSION / ASSESSMENT AND PLAN   Initial Evaluation:  Patient presents with chest discomfort and upper back pain, feeling similar to a previous heart attack. Plan:  Repeat EKG Obtain second troponin test Consider CT scan to rule out aortic dissection due to elevated blood pressure and pain in chest and upper back  Initial DDx:       myocardial ischemia, aortic dissection, musculoskeletal pain, and gastrointestinal causes such as gas.  ED Course   Cta reviewed and interpreted by myself without evidence of dissection, PE or other abnormalities. Ecg reassuring. Troponins flat at 5, doubt ACS. Suspect possible indigestion/gas as the cause. Will re-initiate the pantoprazole  he is supposed to take and miralax pending PCP follow up.       Images ordered viewed and obtained by myself. Agree with  Radiology interpretation. Details in ED course.  Labs ordered reviewed by myself as detailed in ED course.  Consultations obtained/considered detailed in ED course.     FINAL IMPRESSION Final diagnoses:  Nonspecific chest pain     Disposition A medical screening exam was performed and I feel the patient has had an appropriate workup for their chief complaint at this time and likelihood of emergent condition existing is low. They have been counseled on decision, DISCHARGE, follow up and which symptoms necessitate immediate return to the emergency department. They or their family verbally stated understanding and agreement with plan and discharged in stable condition.   ____________________________________________   NEW OUTPATIENT MEDICATIONS STARTED DURING THIS VISIT:  Discharge Medication List as of 01/02/2024  3:02 AM      Note:  This note was prepared with assistance of Dragon voice recognition software. Occasional wrong-word or sound-a-like substitutions may have occurred due to the inherent limitations of voice recognition software.    Emiko Osorto, Selinda, MD 01/02/24 [phone]

## 2024-01-13 ENCOUNTER — Other Ambulatory Visit: Payer: Self-pay | Admitting: Cardiovascular Disease

## 2024-01-13 DIAGNOSIS — I214 Non-ST elevation (NSTEMI) myocardial infarction: Secondary | ICD-10-CM

## 2024-01-19 ENCOUNTER — Other Ambulatory Visit: Payer: Self-pay | Admitting: Cardiovascular Disease

## 2024-03-23 ENCOUNTER — Other Ambulatory Visit: Payer: Self-pay | Admitting: Cardiovascular Disease

## 2024-03-25 ENCOUNTER — Telehealth: Payer: Self-pay | Admitting: *Deleted

## 2024-03-25 ENCOUNTER — Telehealth: Payer: Self-pay | Admitting: Cardiology

## 2024-03-25 MED ORDER — LOSARTAN POTASSIUM 100 MG PO TABS: 100.0000 mg | ORAL_TABLET | Freq: Every day | ORAL | 3 refills | Status: DC

## 2024-03-25 MED ORDER — ROSUVASTATIN CALCIUM 40 MG PO TABS: 40.0000 mg | ORAL_TABLET | Freq: Every day | ORAL | 3 refills | Status: AC

## 2024-03-25 MED ORDER — ROSUVASTATIN CALCIUM 40 MG PO TABS: 40.0000 mg | ORAL_TABLET | Freq: Every day | ORAL | 3 refills | Status: DC

## 2024-03-25 MED ORDER — LOSARTAN POTASSIUM 100 MG PO TABS: 100.0000 mg | ORAL_TABLET | Freq: Every day | ORAL | 3 refills | Status: AC

## 2024-03-25 NOTE — Telephone Encounter (Signed)
 Pt called and request refills for Losartan  and Rosuvastatin . Patient seen provider on 12/2023 and refills sent to pt pharmacy. Pt made aware.

## 2024-03-25 NOTE — Telephone Encounter (Signed)
*  STAT* If patient is at the pharmacy, call can be transferred to refill team.   1. Which medications need to be refilled? (please list name of each medication and dose if known)   losartan  (COZAAR ) 100 MG tablet    rosuvastatin  (CRESTOR ) 40 MG tablet     2. Would you like to learn more about the convenience, safety, & potential cost savings by using the [facility]? no   3. Are you open to using the [facility] no   4. Which pharmacy/location (including street and city if local pharmacy) is medication to be sent to? [facility] - [place], [place] - [address]    5. Do they need a 30 day or 90 day supply? 90

## 2024-03-25 NOTE — Telephone Encounter (Signed)
 Requested Prescriptions   Signed Prescriptions Disp Refills   losartan  (COZAAR ) 100 MG tablet 90 tablet 3    Sig: Take 1 tablet (100 mg total) by mouth daily.    Authorizing Provider: DARLISS ROGUE    Ordering User: Izacc Demeyer  C   rosuvastatin  (CRESTOR ) 40 MG tablet 90 tablet 3    Sig: Take 1 tablet (40 mg total) by mouth daily.    Authorizing Provider: DARLISS ROGUE    Ordering User: WILFRED, Lizzeth Meder  C
